# Patient Record
Sex: Female | Born: 1995 | Race: Black or African American | Hispanic: No | Marital: Single | Smoking: Former smoker
Health system: Southern US, Community
[De-identification: ages and names within clinical notes are randomized; demographics above are authoritative.]

## PROBLEM LIST (undated history)

## (undated) DIAGNOSIS — J45909 Unspecified asthma, uncomplicated: Secondary | ICD-10-CM

## (undated) DIAGNOSIS — N390 Urinary tract infection, site not specified: Secondary | ICD-10-CM

## (undated) DIAGNOSIS — A499 Bacterial infection, unspecified: Secondary | ICD-10-CM

## (undated) HISTORY — PX: NO PAST SURGERIES: SHX2092

## (undated) HISTORY — DX: Unspecified asthma, uncomplicated: J45.909

---
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
## 2015-01-06 ENCOUNTER — Ambulatory Visit: Payer: Medicaid Other | Admitting: Certified Nurse Midwife

## 2015-01-11 ENCOUNTER — Telehealth: Payer: Self-pay | Admitting: *Deleted

## 2015-01-11 NOTE — Telephone Encounter (Signed)
Pt called to office requesting appt for IUD removal. In review with Rachelle, pt is advised to keep IUD in for 4 months to allow her body to adjust to IUD.  Pt was last seen in office on 12/21/14. Per [place], IUD to not be removed.  Attempt to contact pt, LM on VM to call office.

## 2015-01-13 ENCOUNTER — Encounter: Payer: Self-pay | Admitting: Certified Nurse Midwife

## 2015-01-13 ENCOUNTER — Ambulatory Visit (INDEPENDENT_AMBULATORY_CARE_PROVIDER_SITE_OTHER): Payer: Medicaid Other | Admitting: Certified Nurse Midwife

## 2015-01-13 VITALS — BP 119/69 | HR 76 | Temp 98.3°F | Wt 123.0 lb

## 2015-01-13 DIAGNOSIS — Z30431 Encounter for routine checking of intrauterine contraceptive device: Secondary | ICD-10-CM | POA: Diagnosis not present

## 2015-01-13 DIAGNOSIS — Z30013 Encounter for initial prescription of injectable contraceptive: Secondary | ICD-10-CM | POA: Diagnosis not present

## 2015-01-13 MED ORDER — MEDROXYPROGESTERONE ACETATE 150 MG/ML IM SUSP: 150.0000 mg | INTRAMUSCULAR | Status: DC

## 2015-01-14 NOTE — Progress Notes (Signed)
Patient ID: Karen Rojas, female   DOB: 1995/07/15, 19 y.o.   MRN: [id number]  Chief Complaint  Patient presents with  . Follow-up    HPI Karen Rojas is a 19 y.o. female.  Here for spotting/period like bleeding with Skyla and desires for it to be removed.  Reinforcement given about the body's need for time to adjust to the IUD.  Patient verabalized understanding.  Is not soaking pads with bleeding or having heavy cramping.  States that she is tired of the bleeding.  Education given about the normalcy of the bleeding and break in period of time.  Educated patient on POC:  Keep IUD, give one dose of Depo IM to stop current spotting.  Patient agreed to the plan.  Rx sent for Depo injection to the pharmacy, patient told to come back for the injection.  If still bleeding in 3 months, and not pleased with the IUD, then I would consider removal.  Is still sexually active.     HPI  Past Medical History  Diagnosis Date  . Asthma     History reviewed. No pertinent past surgical history.  Family History  Problem Relation Age of Onset  . Kidney disease Mother     Social History Social History  Substance Use Topics  . Smoking status: Former Games developermoker  . Smokeless tobacco: Never Used  . Alcohol Use: No    No Known Allergies  Current Outpatient Prescriptions  Medication Sig Dispense Refill  . cyclobenzaprine (FLEXERIL) 10 MG tablet Take 1 tablet (10 mg total) by mouth 3 (three) times daily as needed for muscle spasms. (Patient not taking: Reported on 01/13/2015) 30 tablet 0  . fluconazole (DIFLUCAN) 100 MG tablet Take 1 tablet (100 mg total) by mouth once. Repeat dose in 48-72 hour. (Patient not taking: Reported on 01/13/2015) 3 tablet 0  . ibuprofen (ADVIL,MOTRIN) 800 MG tablet Take 1 tablet (800 mg total) by mouth every 8 (eight) hours as needed. (Patient not taking: Reported on 01/13/2015) 60 tablet 1  . medroxyPROGESTERone (DEPO-PROVERA) 150 MG/ML injection Inject 1 mL (150 mg total) into  the muscle every 3 (three) months. 1 mL 0  . meloxicam (MOBIC) 15 MG tablet Take 1 tablet (15 mg total) by mouth daily. (Patient not taking: Reported on 01/13/2015) 30 tablet 2  . metroNIDAZOLE (FLAGYL) 500 MG tablet Take 1 tablet (500 mg total) by mouth 2 (two) times daily. (Patient not taking: Reported on 01/13/2015) 14 tablet 0  . misoprostol (CYTOTEC) 200 MCG tablet Take 1 tablet (200 mcg total) by mouth 4 (four) times daily. (Patient not taking: Reported on 01/13/2015) 3 tablet 0  . terconazole (TERAZOL 7) 0.4 % vaginal cream Place 1 applicator vaginally at bedtime. (Patient not taking: Reported on 01/13/2015) 45 g 0   No current facility-administered medications for this visit.    Review of Systems Review of Systems Constitutional: negative for fatigue and weight loss Respiratory: negative for cough and wheezing Cardiovascular: negative for chest pain, fatigue and palpitations Gastrointestinal: negative for abdominal pain and change in bowel habits Genitourinary:negative Integument/breast: negative for nipple discharge Musculoskeletal:negative for myalgias Neurological: negative for gait problems and tremors Behavioral/Psych: negative for abusive relationship, depression Endocrine: negative for temperature intolerance     Blood pressure 119/69, pulse 76, temperature 98.3 F (36.8 C), weight 123 lb (55.792 kg).  Physical Exam Physical Exam General:   alert  Skin:   no rash or abnormalities  Lungs:   clear to auscultation bilaterally  Heart:   regular rate  and rhythm, S1, S2 normal, no murmur, click, rub or gallop  Breasts:   normal without suspicious masses, skin or nipple changes or axillary nodes  Abdomen:  normal findings: no organomegaly, soft, non-tender and no hernia  Pelvis:  External genitalia: normal general appearance Urinary system: urethral meatus normal and bladder without fullness, nontender Vaginal: normal without tenderness, induration or masses Cervix: normal  appearance, strings present on exam.  + very small amount of brownish red menses in vault.   Adnexa: normal bimanual exam Uterus: anteverted and non-tender, normal size    50% of 15 min visit spent on counseling and coordination of care.   Data Reviewed Previous medical hx, meds  Assessment     IUD strings present, IUD positioned correctly Spotting with IUD Contraception management     Plan    No orders of the defined types were placed in this encounter.   Meds ordered this encounter  Medications  . medroxyPROGESTERone (DEPO-PROVERA) 150 MG/ML injection    Sig: Inject 1 mL (150 mg total) into the muscle every 3 (three) months.    Dispense:  1 mL    Refill:  0     Possible manan previous recordsgement options include: IUD removal after significant amount of break in time for body's adjustment.   Follow up in 3 months.

## 2015-01-18 NOTE — Telephone Encounter (Signed)
Pt has been seen in office since call. This call closed.

## 2015-02-21 ENCOUNTER — Ambulatory Visit: Payer: Medicaid Other | Admitting: Certified Nurse Midwife

## 2015-02-22 ENCOUNTER — Encounter: Payer: Self-pay | Admitting: Certified Nurse Midwife

## 2015-02-22 ENCOUNTER — Ambulatory Visit (INDEPENDENT_AMBULATORY_CARE_PROVIDER_SITE_OTHER): Payer: Medicaid Other | Admitting: Certified Nurse Midwife

## 2015-02-22 VITALS — BP 117/73 | HR 69 | Wt 119.0 lb

## 2015-02-22 DIAGNOSIS — R102 Pelvic and perineal pain: Secondary | ICD-10-CM

## 2015-02-22 DIAGNOSIS — N760 Acute vaginitis: Secondary | ICD-10-CM | POA: Diagnosis not present

## 2015-02-22 DIAGNOSIS — Z8742 Personal history of other diseases of the female genital tract: Secondary | ICD-10-CM

## 2015-02-22 DIAGNOSIS — B9689 Other specified bacterial agents as the cause of diseases classified elsewhere: Secondary | ICD-10-CM

## 2015-02-22 DIAGNOSIS — A499 Bacterial infection, unspecified: Secondary | ICD-10-CM

## 2015-02-22 MED ORDER — METRONIDAZOLE 0.75 % VA GEL: 1.0000 | VAGINAL | Status: DC

## 2015-02-22 NOTE — Addendum Note (Signed)
Addended by: Marya Landry D on: 02/22/2015 04:39 PM   Modules accepted: Orders

## 2015-02-22 NOTE — Progress Notes (Signed)
Patient ID: Karen Rojas, female   DOB: 07-10-1995, 20 y.o.   MRN: [id number]  Chief Complaint  Patient presents with  . Advice Only    wants skyla removed    HPI Karen Rojas is a 20 y.o. female.  Here for f/u on Skyla IUD.  Patient desires removal.  Patient states that she is not having any bleeding.  She was having long, painful periods with clots along with dysmenorrhea.  States that is all improved with IUD.  Still desires removal.  Encouraged to state why.  When stating why she describes cramping after sexual intercourse, no pain during sexual intercourse.  Denies any pain after a few hours post sexual intercourse, and cannot feel the IUD.  Has not felt for the strings.  Encouraged patient to wait it has only been about two months since the insertion.  Patient also states that she had cramping before and after sexual intercourse before the IUD.  I encouraged the patient that removing the IUD is not going to solve the problem and may make it worse.   I also educated the patient that the side effects from Depo injections are worse than the IUD.      HPI  Past Medical History  Diagnosis Date  . Asthma     No past surgical history on file.  Family History  Problem Relation Age of Onset  . Kidney disease Mother     Social History Social History  Substance Use Topics  . Smoking status: Former Games developer  . Smokeless tobacco: Never Used  . Alcohol Use: No    No Known Allergies  Current Outpatient Prescriptions  Medication Sig Dispense Refill  . cyclobenzaprine (FLEXERIL) 10 MG tablet Take 1 tablet (10 mg total) by mouth 3 (three) times daily as needed for muscle spasms. (Patient not taking: Reported on 01/13/2015) 30 tablet 0  . ibuprofen (ADVIL,MOTRIN) 800 MG tablet Take 1 tablet (800 mg total) by mouth every 8 (eight) hours as needed. (Patient not taking: Reported on 01/13/2015) 60 tablet 1  . medroxyPROGESTERone (DEPO-PROVERA) 150 MG/ML injection Inject 1 mL (150 mg total) into  the muscle every 3 (three) months. (Patient not taking: Reported on 02/22/2015) 1 mL 0  . meloxicam (MOBIC) 15 MG tablet Take 1 tablet (15 mg total) by mouth daily. (Patient not taking: Reported on 01/13/2015) 30 tablet 2  . metroNIDAZOLE (METROGEL VAGINAL) 0.75 % vaginal gel Place 1 Applicatorful vaginally 2 (two) times a week. For 4-6 months. 70 g 4   No current facility-administered medications for this visit.    Review of Systems Review of Systems Constitutional: negative for fatigue and weight loss Respiratory: negative for cough and wheezing Cardiovascular: negative for chest pain, fatigue and palpitations Gastrointestinal: negative for abdominal pain and change in bowel habits Genitourinary: +cramping with sexual intercourse, change in vaginal discharge Integument/breast: negative for nipple discharge Musculoskeletal:negative for myalgias Neurological: negative for gait problems and tremors Behavioral/Psych: negative for abusive relationship, depression Endocrine: negative for temperature intolerance     Blood pressure 117/73, pulse 69, weight 119 lb (53.978 kg).  Physical Exam Physical Exam General:   alert  Skin:   no rash or abnormalities  Lungs:   clear to auscultation bilaterally  Heart:   regular rate and rhythm, S1, S2 normal, no murmur, click, rub or gallop  Breasts:   normal without suspicious masses, skin or nipple changes or axillary nodes  Abdomen:  normal findings: no organomegaly, soft, non-tender and no hernia  Pelvis:  External genitalia:  normal general appearance Urinary system: urethral meatus normal and bladder without fullness, nontender Vaginal: normal without tenderness, induration or masses Cervix: no CMT Adnexa: normal bimanual exam, slightly enlarged on right side with slight tenderness Uterus: anteverted and non-tender, normal size    50% of 15 min visit spent on counseling and coordination of care.   Data Reviewed Previous medical hx,  meds  Assessment     IUD in place ? Ovarian cyst Chronic BV     Plan    Orders Placed This Encounter  Procedures  . US Transvaginal Non-OB    Standing Status: Future     Number of Occurrences:      Standing Expiration Date: 04/21/2016    Order Specific Question:  Reason for Exam (SYMPTOM  OR DIAGNOSIS REQUIRED)    Answer:  lower abdominal cramping left side, hx of ovarian cyst    Order Specific Question:  Preferred imaging location?    Answer:  Internal  . US Pelvis Complete    Standing Status: Future     Number of Occurrences:      Standing Expiration Date: 04/21/2016    Order Specific Question:  Reason for Exam (SYMPTOM  OR DIAGNOSIS REQUIRED)    Answer:  lower abdominal cramping left side, hx of ovarian cyst    Order Specific Question:  Preferred imaging location?    Answer:  Internal   Meds ordered this encounter  Medications  . metroNIDAZOLE (METROGEL VAGINAL) 0.75 % vaginal gel    Sig: Place 1 Applicatorful vaginally 2 (two) times a week. For 4-6 months.    Dispense:  70 g    Refill:  4     Follow up after [place].

## 2015-02-25 ENCOUNTER — Other Ambulatory Visit: Payer: Self-pay | Admitting: Certified Nurse Midwife

## 2015-02-25 LAB — SURESWAB, VAGINOSIS/VAGINITIS PLUS
Atopobium vaginae: 7.1 Log (cells/mL)
C. PARAPSILOSIS, DNA: NOT DETECTED
C. TROPICALIS, DNA: NOT DETECTED
C. albicans, DNA: NOT DETECTED
C. glabrata, DNA: NOT DETECTED
C. trachomatis RNA, TMA: NOT DETECTED
Gardnerella vaginalis: >8 Log (cells/mL)
LACTOBACILLUS SPECIES: NOT DETECTED Log (cells/mL)
MEGASPHAERA SPECIES: 7.9 Log (cells/mL)
N. GONORRHOEAE RNA, TMA: NOT DETECTED
T. vaginalis RNA, QL TMA: NOT DETECTED

## 2015-02-28 ENCOUNTER — Telehealth: Payer: Self-pay

## 2015-02-28 NOTE — Telephone Encounter (Signed)
Patient made new gyn appt at [facility] on 03/23/15 - she had ultrasound and f/u appt with Rachelle on 2/2 - patient never returned messages regarding those appts,  took off our sch, ok per [employer].

## 2015-03-02 ENCOUNTER — Ambulatory Visit: Payer: Medicaid Other | Admitting: Certified Nurse Midwife

## 2015-03-02 ENCOUNTER — Other Ambulatory Visit: Payer: Medicaid Other

## 2015-03-06 ENCOUNTER — Encounter (HOSPITAL_COMMUNITY): Payer: Self-pay | Admitting: *Deleted

## 2015-03-06 ENCOUNTER — Emergency Department (HOSPITAL_COMMUNITY)
Admission: EM | Admit: 2015-03-06 | Discharge: 2015-03-06 | Discharge status: Against Medical Advice | Payer: Medicaid Other | Attending: Emergency Medicine | Admitting: Emergency Medicine

## 2015-03-06 DIAGNOSIS — Z79899 Other long term (current) drug therapy: Secondary | ICD-10-CM | POA: Diagnosis not present

## 2015-03-06 DIAGNOSIS — J45909 Unspecified asthma, uncomplicated: Secondary | ICD-10-CM | POA: Insufficient documentation

## 2015-03-06 DIAGNOSIS — Z87891 Personal history of nicotine dependence: Secondary | ICD-10-CM | POA: Diagnosis not present

## 2015-03-06 DIAGNOSIS — N898 Other specified noninflammatory disorders of vagina: Secondary | ICD-10-CM | POA: Diagnosis not present

## 2015-03-06 DIAGNOSIS — Z3202 Encounter for pregnancy test, result negative: Secondary | ICD-10-CM | POA: Insufficient documentation

## 2015-03-06 DIAGNOSIS — Z30431 Encounter for routine checking of intrauterine contraceptive device: Secondary | ICD-10-CM | POA: Insufficient documentation

## 2015-03-06 DIAGNOSIS — N949 Unspecified condition associated with female genital organs and menstrual cycle: Secondary | ICD-10-CM

## 2015-03-06 LAB — WET PREP, GENITAL
SPERM: NONE SEEN
TRICH WET PREP: NONE SEEN
YEAST WET PREP: NONE SEEN

## 2015-03-06 LAB — URINALYSIS, ROUTINE W REFLEX MICROSCOPIC
Bilirubin Urine: NEGATIVE
GLUCOSE, UA: NEGATIVE mg/dL
HGB URINE DIPSTICK: NEGATIVE
KETONES UR: 15 mg/dL — AB
LEUKOCYTES UA: NEGATIVE
Nitrite: NEGATIVE
PROTEIN: NEGATIVE mg/dL
Specific Gravity, Urine: 1.024 (ref 1.005–1.030)
pH: 6.5 (ref 5.0–8.0)

## 2015-03-06 LAB — POC URINE PREG, ED: PREG TEST UR: NEGATIVE

## 2015-03-06 NOTE — ED Provider Notes (Signed)
CSN: [id number]     Arrival date & time 03/06/15  1805 History  By signing my name below, I, Linus Galas, attest that this documentation has been prepared under the direction and in the presence of Arthor Captain, PA-C. Electronically Signed: Linus Galas, ED Scribe. 03/06/2015. 9:20 PM.  Chief Complaint  Patient presents with  . Dysuria  . Vaginal Discharge   The history is provided by the patient. No language interpreter was used.   HPI Comments: Karen Rojas is a 20 y.o. female with no past pertinent history who presents to the Emergency Department complaining of throbbing lower abdominal pain for the past couple of days. Pt also reports difficulty urinating. Pt states she had a Skyla IUD placed a couple of months ago and has been having abdominal cramping since but began having these symptoms only for the past couple of days. Pts last sexual intercourse was 2 weeks ago. She is in a monogamous relationship. Pt denies any nausea, vomiting or any other sx at this time.   Past Medical History  Diagnosis Date  . Asthma    History reviewed. No pertinent past surgical history. Family History  Problem Relation Age of Onset  . Kidney disease Mother    Social History  Substance Use Topics  . Smoking status: Former Games developer  . Smokeless tobacco: Never Used  . Alcohol Use: No   OB History    Gravida Para Term Preterm AB TAB SAB Ectopic Multiple Living       Review of Systems  Constitutional: Negative for fever and chills.  Gastrointestinal: Positive for abdominal pain. Negative for nausea and vomiting.  Genitourinary: Positive for difficulty urinating.   Allergies  Review of patient's allergies indicates no known allergies.  Home Medications   Prior to Admission medications   Medication Sig Start Date End Date Taking? Authorizing Provider  cyclobenzaprine (FLEXERIL) 10 MG tablet Take 1 tablet (10 mg total) by mouth 3 (three) times daily as needed for muscle  spasms. Patient not taking: Reported on 01/13/2015 12/21/14   Rachelle A Denney, CNM  ibuprofen (ADVIL,MOTRIN) 800 MG tablet Take 1 tablet (800 mg total) by mouth every 8 (eight) hours as needed. Patient not taking: Reported on 01/13/2015 12/01/14   Roe Coombs, CNM  medroxyPROGESTERone (DEPO-PROVERA) 150 MG/ML injection Inject 1 mL (150 mg total) into the muscle every 3 (three) months. Patient not taking: Reported on 02/22/2015 01/13/15   Roe Coombs, CNM  meloxicam (MOBIC) 15 MG tablet Take 1 tablet (15 mg total) by mouth daily. Patient not taking: Reported on 01/13/2015 12/21/14   Rachelle A Denney, CNM  metroNIDAZOLE (METROGEL VAGINAL) 0.75 % vaginal gel Place 1 Applicatorful vaginally 2 (two) times a week. For 4-6 months. 02/22/15   Rachelle A Denney, CNM   BP 141/64 mmHg  Pulse 85  Temp(Src) 98.7 F (37.1 C) (Oral)  Resp 16  SpO2 100%   Physical Exam  Constitutional: She is oriented to person, place, and time. She appears well-developed and well-nourished.  HENT:  Head: Normocephalic and atraumatic.  Cardiovascular: Normal rate.   Pulmonary/Chest: Effort normal.  Abdominal: She exhibits no distension.  Genitourinary:  Pelvic exam: normal external genitalia, vulva, vagina, cervix, uterus and adnexa. IUD strings are visible from cervical os. She has no CMT, no adnexal pain or masses.   Neurological: She is alert and oriented to person, place, and time.  Skin: Skin is warm and dry.  Psychiatric: She has  a normal mood and affect.  Nursing note and vitals reviewed.   ED Course  Procedures  DIAGNOSTIC STUDIES: Oxygen Saturation is 100% on room air, normal by my interpretation.    COORDINATION OF CARE: 8:54 PM Will order pregnancy screening and urinalysis. Discussed treatment plan with pt at bedside and pt agreed to plan.  Labs Review Labs Reviewed  URINALYSIS, ROUTINE W REFLEX MICROSCOPIC (NOT AT [facility]) - Abnormal; Notable for the following:    Ketones, ur 15 (*)     All other components within normal limits  WET PREP, GENITAL  POC URINE PREG, ED  GC/CHLAMYDIA PROBE AMP ([place]) NOT AT [facility]    Imaging Review No results found.    EKG Interpretation None      MDM   Final diagnoses:  None    Patient asked for me to remove her IUD. I told her that she would need to have this done at her ob gyn. Patient became upset and refused to wait for wet prep. UA and preg test negative. Patient eloped from ED. I personally performed the services described in this documentation, which was scribed in my presence. The recorded information has been reviewed and is accurate.    Arthor Captain, PA-C 03/08/15 1918  Loren Racer, MD 03/11/15 [phone]

## 2015-03-06 NOTE — ED Notes (Signed)
Pt walked out of the ER in NAD

## 2015-03-06 NOTE — ED Notes (Signed)
Pt reports lower abd pain and cramping. Has been seen at [facility] clinic for same. Having vaginal discharge and difficulty urinating.

## 2015-03-07 LAB — GC/CHLAMYDIA PROBE AMP (~~LOC~~) NOT AT ARMC
Chlamydia: NEGATIVE
Neisseria Gonorrhea: NEGATIVE

## 2015-03-23 ENCOUNTER — Encounter: Payer: Self-pay | Admitting: Obstetrics & Gynecology

## 2015-04-28 ENCOUNTER — Encounter (HOSPITAL_COMMUNITY): Payer: Self-pay

## 2015-04-28 ENCOUNTER — Emergency Department (HOSPITAL_COMMUNITY)
Admission: EM | Admit: 2015-04-28 | Discharge: 2015-04-28 | Disposition: A | Discharge status: Home / Self Care | Payer: Medicaid Other | Attending: Emergency Medicine | Admitting: Emergency Medicine

## 2015-04-28 DIAGNOSIS — J45909 Unspecified asthma, uncomplicated: Secondary | ICD-10-CM | POA: Diagnosis not present

## 2015-04-28 DIAGNOSIS — R8290 Unspecified abnormal findings in urine: Secondary | ICD-10-CM | POA: Diagnosis not present

## 2015-04-28 DIAGNOSIS — R300 Dysuria: Secondary | ICD-10-CM | POA: Diagnosis present

## 2015-04-28 DIAGNOSIS — Z8619 Personal history of other infectious and parasitic diseases: Secondary | ICD-10-CM | POA: Diagnosis not present

## 2015-04-28 DIAGNOSIS — R350 Frequency of micturition: Secondary | ICD-10-CM | POA: Diagnosis not present

## 2015-04-28 DIAGNOSIS — Z87891 Personal history of nicotine dependence: Secondary | ICD-10-CM | POA: Diagnosis not present

## 2015-04-28 HISTORY — DX: Bacterial infection, unspecified: A49.9

## 2015-04-28 HISTORY — DX: Urinary tract infection, site not specified: N39.0

## 2015-04-28 LAB — URINALYSIS, ROUTINE W REFLEX MICROSCOPIC
BILIRUBIN URINE: NEGATIVE
Glucose, UA: NEGATIVE mg/dL
HGB URINE DIPSTICK: NEGATIVE
KETONES UR: NEGATIVE mg/dL
NITRITE: NEGATIVE
PH: 7.5 (ref 5.0–8.0)
Protein, ur: NEGATIVE mg/dL
Specific Gravity, Urine: 1.017 (ref 1.005–1.030)

## 2015-04-28 LAB — URINE MICROSCOPIC-ADD ON

## 2015-04-28 MED ORDER — CIPROFLOXACIN HCL 500 MG PO TABS: 500.0000 mg | ORAL_TABLET | Freq: Two times a day (BID) | ORAL | Status: DC

## 2015-04-28 MED ORDER — PHENAZOPYRIDINE HCL 200 MG PO TABS: 200.0000 mg | ORAL_TABLET | Freq: Three times a day (TID) | ORAL | Status: DC | PRN

## 2015-04-28 NOTE — ED Notes (Signed)
Pt with burning and irritation urinary x 3 days.  Pt able to empty bladder.  No flank pain.  No fever. Some discharge color

## 2015-04-28 NOTE — ED Provider Notes (Signed)
CSN: [id number]     Arrival date & time 04/28/15  1544 History   First MD Initiated Contact with Patient 04/28/15 1856     Chief Complaint  Patient presents with  . Dysuria     (Consider location/radiation/quality/duration/timing/severity/associated sxs/prior Treatment) HPI This is a 20 year old female who presents emergency Department with chief complaint of dysuria and urinary frequency. Patient states she's had multiple urinary tract infections before and this feels similar. She denies suprapubic pain, vaginal symptoms, chills, fever, back pain.   Past Medical History  Diagnosis Date  . Asthma   . Urinary tract bacterial infections    No past surgical history on file. Family History  Problem Relation Age of Onset  . Kidney disease Mother    Social History  Substance Use Topics  . Smoking status: Former Games developermoker  . Smokeless tobacco: Never Used  . Alcohol Use: No   OB History    Gravida Para Term Preterm AB TAB SAB Ectopic Multiple Living   0 0 0 0 0 0 0 0 0 0      Review of Systems  Ten systems reviewed and are negative for acute change, except as noted in the HPI.    Allergies  Review of patient's allergies indicates no known allergies.  Home Medications   Prior to Admission medications   Medication Sig Start Date End Date Taking? Authorizing Provider  ciprofloxacin (CIPRO) 500 MG tablet Take 1 tablet (500 mg total) by mouth 2 (two) times daily. 04/28/15   Arthor CaptainAbigail Tenzin Pavon, PA-C  cyclobenzaprine (FLEXERIL) 10 MG tablet Take 1 tablet (10 mg total) by mouth 3 (three) times daily as needed for muscle spasms. Patient not taking: Reported on 01/13/2015 12/21/14   Rachelle A Denney, CNM  ibuprofen (ADVIL,MOTRIN) 800 MG tablet Take 1 tablet (800 mg total) by mouth every 8 (eight) hours as needed. Patient not taking: Reported on 01/13/2015 12/01/14   Roe Coombsachelle A Denney, CNM  medroxyPROGESTERone (DEPO-PROVERA) 150 MG/ML injection Inject 1 mL (150 mg total) into the muscle every  3 (three) months. Patient not taking: Reported on 02/22/2015 01/13/15   Roe Coombsachelle A Denney, CNM  meloxicam (MOBIC) 15 MG tablet Take 1 tablet (15 mg total) by mouth daily. Patient not taking: Reported on 01/13/2015 12/21/14   Rachelle A Denney, CNM  metroNIDAZOLE (METROGEL VAGINAL) 0.75 % vaginal gel Place 1 Applicatorful vaginally 2 (two) times a week. For 4-6 months. 02/22/15   Roe Coombsachelle A Denney, CNM  phenazopyridine (PYRIDIUM) 200 MG tablet Take 1 tablet (200 mg total) by mouth 3 (three) times daily as needed (pain with urination). 04/28/15   Revonda Menter, PA-C   BP 125/79 mmHg  Pulse 95  Temp(Src) 98.1 F (36.7 C) (Oral)  Resp 16  SpO2 95% Physical Exam  Constitutional: She is oriented to person, place, and time. She appears well-developed and well-nourished. No distress.  HENT:  Head: Normocephalic and atraumatic.  Eyes: Conjunctivae are normal. No scleral icterus.  Neck: Normal range of motion.  Cardiovascular: Normal rate, regular rhythm and normal heart sounds.  Exam reveals no gallop and no friction rub.   No murmur heard. Pulmonary/Chest: Effort normal and breath sounds normal. No respiratory distress.  Abdominal: Soft. Bowel sounds are normal. She exhibits no distension and no mass. There is no tenderness. There is no guarding.   No cva tenderness   Neurological: She is alert and oriented to person, place, and time.  Skin: Skin is warm and dry. She is not diaphoretic.  Nursing note and vitals reviewed.  ED Course  Procedures (including critical care time) Labs Review Labs Reviewed  URINALYSIS, ROUTINE W REFLEX MICROSCOPIC (NOT AT [facility]) - Abnormal; Notable for the following:    APPearance HAZY (*)    Leukocytes, UA SMALL (*)    All other components within normal limits  URINE MICROSCOPIC-ADD ON - Abnormal; Notable for the following:    Squamous Epithelial / LPF 0-5 (*)    Bacteria, UA RARE (*)    All other components within normal limits    Imaging Review No  results found. I have personally reviewed and evaluated these images and lab results as part of my medical decision-making.   EKG Interpretation None      MDM   Final diagnoses:  Abnormal urinalysis    7:30 PM The UA is equivocal but patient states that she has had multiple UTIs. Review of the EMR urine culture shows no resistance to cipro. Will DC with CIPRO and pyridium.  Pt has been diagnosed with a UTI. Pt is afebrile, no CVA tenderness, normotensive, and denies N/V. Pt to be dc home with antibiotics and instructions to follow up with PCP if symptoms persist.    Arthor Captain, PA-C 04/28/15 1934  Alvira Monday, MD 05/01/15 1429

## 2015-04-28 NOTE — Discharge Instructions (Signed)

## 2015-05-29 ENCOUNTER — Encounter (HOSPITAL_COMMUNITY): Payer: Self-pay | Admitting: Emergency Medicine

## 2015-05-29 ENCOUNTER — Ambulatory Visit (HOSPITAL_COMMUNITY)
Admission: EM | Admit: 2015-05-29 | Discharge: 2015-05-29 | Disposition: A | Discharge status: Home / Self Care | Payer: Medicaid Other | Attending: Family Medicine | Admitting: Family Medicine

## 2015-05-29 DIAGNOSIS — N898 Other specified noninflammatory disorders of vagina: Secondary | ICD-10-CM

## 2015-05-29 DIAGNOSIS — N760 Acute vaginitis: Secondary | ICD-10-CM | POA: Diagnosis not present

## 2015-05-29 LAB — POCT URINALYSIS DIP (DEVICE)
Bilirubin Urine: NEGATIVE
Glucose, UA: NEGATIVE mg/dL
HGB URINE DIPSTICK: NEGATIVE
KETONES UR: NEGATIVE mg/dL
Leukocytes, UA: NEGATIVE
Nitrite: NEGATIVE
PH: 6.5 (ref 5.0–8.0)
PROTEIN: NEGATIVE mg/dL
SPECIFIC GRAVITY, URINE: 1.02 (ref 1.005–1.030)
Urobilinogen, UA: 1 mg/dL (ref 0.0–1.0)

## 2015-05-29 LAB — POCT PREGNANCY, URINE: PREG TEST UR: NEGATIVE

## 2015-05-29 MED ORDER — METRONIDAZOLE 500 MG PO TABS: 500.0000 mg | ORAL_TABLET | Freq: Two times a day (BID) | ORAL | Status: DC

## 2015-05-29 MED ORDER — FLUCONAZOLE 150 MG PO TABS: ORAL_TABLET | ORAL | Status: DC

## 2015-05-29 NOTE — ED Provider Notes (Signed)
CSN: [id number]     Arrival date & time 05/29/15  1603 History   First MD Initiated Contact with Patient 05/29/15 1721     Chief Complaint  Patient presents with  . Vaginitis   (Consider location/radiation/quality/duration/timing/severity/associated sxs/prior Treatment) HPI Comments: 20 year old female complaining of a vaginal discharge for 2 weeks. She is also complaining of vulvar itching and irritation. Denies pelvic pain.  LMP 05/18/2015.   Past Medical History  Diagnosis Date  . Asthma   . Urinary tract bacterial infections    History reviewed. No pertinent past surgical history. Family History  Problem Relation Age of Onset  . Kidney disease Mother    Social History  Substance Use Topics  . Smoking status: Former Games developer  . Smokeless tobacco: Never Used  . Alcohol Use: No   OB History    Gravida Para Term Preterm AB TAB SAB Ectopic Multiple Living       Review of Systems  Constitutional: Negative.   Respiratory: Negative.   Genitourinary: Positive for vaginal discharge and vaginal pain. Negative for dysuria, urgency, frequency, hematuria, flank pain, vaginal bleeding, menstrual problem and pelvic pain.  Musculoskeletal: Negative.   Neurological: Negative.   Psychiatric/Behavioral: Negative.   All other systems reviewed and are negative.   Allergies  Review of patient's allergies indicates no known allergies.  Home Medications   Prior to Admission medications   Medication Sig Start Date End Date Taking? Authorizing Provider  fluconazole (DIFLUCAN) 150 MG tablet 1 tab po x 1. May repeat in 72 hours if no improvement 05/29/15   Hayden Rasmussen, NP  metroNIDAZOLE (FLAGYL) 500 MG tablet Take 1 tablet (500 mg total) by mouth 2 (two) times daily. X 7 days 05/29/15   Hayden Rasmussen, NP   Meds Ordered and Administered this Visit  Medications - No data to display  BP 106/72 mmHg  Pulse 72  Temp(Src) 98.1 F (36.7 C) (Oral)  Resp 12  SpO2 100% No data  found.   Physical Exam  Constitutional: She is oriented to person, place, and time. She appears well-developed and well-nourished. No distress.  Eyes: EOM are normal.  Neck: Normal range of motion. Neck supple.  Cardiovascular: Normal rate.   Pulmonary/Chest: Effort normal. No respiratory distress.  Genitourinary:  Normal external female genitalia. Urethral meatus intact. No swelling or erythema. Vaginal walls and cervix coated with thick white discharge. No cottage cheese discharge. No erythema. Ectocervix is pink, smooth no lesions observed. No CMT or adnexal tenderness.  Musculoskeletal: She exhibits no edema.  Neurological: She is alert and oriented to person, place, and time. She exhibits normal muscle tone.  Skin: Skin is warm and dry.  Psychiatric: She has a normal mood and affect.  Nursing note and vitals reviewed.   ED Course  Procedures (including critical care time)  Labs Review Labs Reviewed  POCT URINALYSIS DIP (DEVICE)  POCT PREGNANCY, URINE  CERVICOVAGINAL ANCILLARY ONLY   Results for orders placed or performed during the hospital encounter of 05/29/15  POCT urinalysis dip (device)  Result Value Ref Range   Glucose, UA NEGATIVE NEGATIVE mg/dL   Bilirubin Urine NEGATIVE NEGATIVE   Ketones, ur NEGATIVE NEGATIVE mg/dL   Specific Gravity, Urine 1.020 1.005 - 1.030   Hgb urine dipstick NEGATIVE NEGATIVE   pH 6.5 5.0 - 8.0   Protein, ur NEGATIVE NEGATIVE mg/dL   Urobilinogen, UA 1.0 0.0 - 1.0 mg/dL   Nitrite NEGATIVE NEGATIVE   Leukocytes, UA  NEGATIVE NEGATIVE  Pregnancy, urine POC  Result Value Ref Range   Preg Test, Ur NEGATIVE NEGATIVE     Imaging Review No results found.   Visual Acuity Review  Right Eye Distance:   Left Eye Distance:   Bilateral Distance:    Right Eye Near:   Left Eye Near:    Bilateral Near:         MDM   1. Vaginal discharge   2. Vaginitis    Meds ordered this encounter  Medications  . metroNIDAZOLE (FLAGYL)  500 MG tablet    Sig: Take 1 tablet (500 mg total) by mouth 2 (two) times daily. X 7 days    Dispense:  14 tablet    Refill:  0    Order Specific Question:  Supervising Provider    Answer:  Linna HoffKINDL, JAMES D [phone]]  . fluconazole (DIFLUCAN) 150 MG tablet    Sig: 1 tab po x 1. May repeat in 72 hours if no improvement    Dispense:  2 tablet    Refill:  0    Order Specific Question:  Supervising Provider    Answer:  Bradd CanaryKINDL, JAMES D [phone][[id number]]   Cervical cytolgy pending.    Hayden Rasmussenavid Glennice Marcos, NP 05/29/15 [phone]

## 2015-05-29 NOTE — ED Notes (Signed)
Patient reports having vaginal discharge and irritation for approx 3 weeks.  Patient has burning with urination, burning involves the outside of body.

## 2015-05-29 NOTE — Discharge Instructions (Signed)
Vaginitis Vaginitis is an inflammation of the vagina. It can happen when the normal bacteria and yeast in the vagina grow too much. There are different types. Treatment will depend on the type you have. HOME CARE  Take all medicines as told by your doctor.  Keep your vagina area clean and dry. Avoid soap. Rinse the area with water.  Avoid washing and cleaning out the vagina (douching).  Do not use tampons or have sex (intercourse) until your treatment is done.  Wipe from front to back after going to the restroom.  Wear cotton underwear.  Avoid wearing underwear while you sleep until your vaginitis is gone.  Avoid tight pants. Avoid underwear or nylons without a cotton panel.  Take off wet clothing (such as a bathing suit) as soon as you can.  Use mild, unscented products. Avoid fabric softeners and scented:  Feminine sprays.  Laundry detergents.  Tampons.  Soaps or bubble baths.  Practice safe sex and use condoms. GET HELP RIGHT AWAY IF:   You have belly (abdominal) pain.  You have a fever or lasting symptoms for more than 2-3 days.  You have a fever and your symptoms suddenly get worse. MAKE SURE YOU:   Understand these instructions.  Will watch this condition.  Will get help right away if you are not doing well or get worse.   This information is not intended to replace advice given to you by your health care provider. Make sure you discuss any questions you have with your health care provider.   Document Released: 04/12/2008 Document Revised: 10/09/2011 Document Reviewed: 06/27/2011 Elsevier Interactive Patient Education 2016 Elsevier Inc.  Bacterial Vaginosis Bacterial vaginosis is an infection of the vagina. It happens when too many germs (bacteria) grow in the vagina. Having this infection puts you at risk for getting other infections from sex. Treating this infection can help lower your risk for other infections, such as:    Chlamydia.  Gonorrhea.  HIV.  Herpes. HOME CARE  Take your medicine as told by your doctor.  Finish your medicine even if you start to feel better.  Tell your sex partner that you have an infection. They should see their doctor for treatment.  During treatment:  Avoid sex or use condoms correctly.  Do not douche.  Do not drink alcohol unless your doctor tells you it is ok.  Do not breastfeed unless your doctor tells you it is ok. GET HELP IF:  You are not getting better after 3 days of treatment.  You have more grey fluid (discharge) coming from your vagina than before.  You have more pain than before.  You have a fever. MAKE SURE YOU:   Understand these instructions.  Will watch your condition.  Will get help right away if you are not doing well or get worse.   This information is not intended to replace advice given to you by your health care provider. Make sure you discuss any questions you have with your health care provider.   Document Released: 10/24/2007 Document Revised: 02/04/2014 Document Reviewed: 08/26/2012 Elsevier Interactive Patient Education 2016 [employer].  Sexually Transmitted Disease A sexually transmitted disease (STD) is a disease or infection often passed to another person during sex. However, STDs can be passed through nonsexual ways. An STD can be passed through:  Spit (saliva).  Semen.  Blood.  Mucus from the vagina.  Pee (urine). HOW CAN I LESSEN MY CHANCES OF GETTING AN STD?  Use:  Latex condoms.  Water-soluble  lubricants with condoms. Do not use petroleum jelly or oils.  Dental dams. These are small pieces of latex that are used as a barrier during oral sex.  Avoid having more than one sex partner.  Do not have sex with someone who has other sex partners.  Do not have sex with anyone you do not know or who is at high risk for an STD.  Avoid risky sex that can break your skin.  Do not have sex if you have  open sores on your mouth or skin.  Avoid drinking too much alcohol or taking illegal drugs. Alcohol and drugs can affect your good judgment.  Avoid oral and anal sex acts.  Get shots (vaccines) for HPV and hepatitis.  If you are at risk of being infected with HIV, it is advised that you take a certain medicine daily to prevent HIV infection. This is called pre-exposure prophylaxis (PrEP). You may be at risk if:  You are a man who has sex with other men (MSM).  You are attracted to the opposite sex (heterosexual) and are having sex with more than one partner.  You take drugs with a needle.  You have sex with someone who has HIV.  Talk with your doctor about if you are at high risk of being infected with HIV. If you begin to take PrEP, get tested for HIV first. Get tested every 3 months for as long as you are taking PrEP.  Get tested for STDs every year if you are sexually active. If you are treated for an STD, get tested again 3 months after you are treated. WHAT SHOULD I DO IF I THINK I HAVE AN STD?  See your doctor.  Tell your sex partner(s) that you have an STD. They should be tested and treated.  Do not have sex until your doctor says it is okay. WHEN SHOULD I GET HELP? Get help right away if:  You have bad belly (abdominal) pain.  You are a man and have puffiness (swelling) or pain in your testicles.  You are a woman and have puffiness in your vagina.   This information is not intended to replace advice given to you by your health care provider. Make sure you discuss any questions you have with your health care provider.   Document Released: 02/22/2004 Document Revised: 02/04/2014 Document Reviewed: 07/10/2012 Elsevier Interactive Patient Education [employer] Elsevier Inc.

## 2015-05-29 NOTE — ED Notes (Signed)
Verified phone number 

## 2015-05-30 LAB — CERVICOVAGINAL ANCILLARY ONLY
CHLAMYDIA, DNA PROBE: NEGATIVE
NEISSERIA GONORRHEA: NEGATIVE
Wet Prep (BD Affirm): NEGATIVE

## 2015-09-19 ENCOUNTER — Encounter (HOSPITAL_COMMUNITY): Payer: Self-pay | Admitting: Emergency Medicine

## 2015-09-19 ENCOUNTER — Ambulatory Visit (HOSPITAL_COMMUNITY)
Admission: EM | Admit: 2015-09-19 | Discharge: 2015-09-19 | Disposition: A | Discharge status: Home / Self Care | Payer: Medicaid Other | Attending: Emergency Medicine | Admitting: Emergency Medicine

## 2015-09-19 DIAGNOSIS — N390 Urinary tract infection, site not specified: Secondary | ICD-10-CM | POA: Diagnosis not present

## 2015-09-19 LAB — POCT URINALYSIS DIP (DEVICE)
Bilirubin Urine: NEGATIVE
GLUCOSE, UA: NEGATIVE mg/dL
KETONES UR: NEGATIVE mg/dL
Nitrite: NEGATIVE
Protein, ur: NEGATIVE mg/dL
Urobilinogen, UA: 0.2 mg/dL (ref 0.0–1.0)
pH: 5 (ref 5.0–8.0)

## 2015-09-19 MED ORDER — CEPHALEXIN 500 MG PO CAPS: 500.0000 mg | ORAL_CAPSULE | Freq: Four times a day (QID) | ORAL | 0 refills | Status: DC

## 2015-09-19 NOTE — Discharge Instructions (Signed)
Drink lots of water May take AZO Standard for urinary symptoms for 2 days as needed

## 2015-09-19 NOTE — ED Triage Notes (Signed)
Pt has been suffering from lower abdominal pain and urinary frequency for 3 days.  She reports recurrent UTI's and states these are the same symptoms.

## 2015-09-19 NOTE — ED Provider Notes (Signed)
CSN: [id number]     Arrival date & time 09/19/15  1317 History   First MD Initiated Contact with Patient 09/19/15 1441     Chief Complaint  Patient presents with  . Abdominal Pain  . Urinary Frequency   (Consider location/radiation/quality/duration/timing/severity/associated sxs/prior Treatment) 20 year old female complaining of urinary frequency and dysuria with suprapubic discomfort for about 3 days. Denies abnormal vaginal discharge. She occasionally has nausea but no vomiting. Last menstrual period was 12 the end of last month states it is approximately time for it began. She has not missed any periods.      Past Medical History:  Diagnosis Date  . Asthma   . Urinary tract bacterial infections    History reviewed. No pertinent surgical history. Family History  Problem Relation Age of Onset  . Kidney disease Mother    Social History  Substance Use Topics  . Smoking status: Former Games developermoker  . Smokeless tobacco: Never Used  . Alcohol use No   OB History    Gravida Para Term Preterm AB Living   0 0 0 0 0 0   SAB TAB Ectopic Multiple Live Births   0 0 0 0       Review of Systems  Constitutional: Negative.   HENT: Negative.   Respiratory: Negative.   Gastrointestinal: Positive for nausea. Negative for abdominal pain and vomiting.  Genitourinary: Positive for dysuria, frequency and urgency. Negative for menstrual problem, vaginal bleeding and vaginal discharge.  Skin: Negative.   All other systems reviewed and are negative.   Allergies  Review of patient's allergies indicates no known allergies.  Home Medications   Prior to Admission medications   Medication Sig Start Date End Date Taking? Authorizing Provider  cephALEXin (KEFLEX) 500 MG capsule Take 1 capsule (500 mg total) by mouth 4 (four) times daily. 09/19/15   Hayden Rasmussenavid Kaiah Hosea, NP   Meds Ordered and Administered this Visit  Medications - No data to display  BP 119/78 (BP Location: Right Arm)   Pulse 76   Temp  98.1 F (36.7 C) (Oral)   Resp 16   Ht 5\' 2"  (1.575 m)   Wt 117 lb (53.1 kg)   LMP 08/24/2015 (Approximate)   SpO2 99%   BMI 21.40 kg/m  No data found.   Physical Exam  Constitutional: She is oriented to person, place, and time. She appears well-developed and well-nourished. No distress.  Eyes: EOM are normal.  Neck: Normal range of motion. Neck supple.  Cardiovascular: Normal rate.   Pulmonary/Chest: Effort normal. No respiratory distress.  Abdominal: Soft. Bowel sounds are normal. She exhibits no distension and no mass. There is no rebound and no guarding.  Minor tenderness over the mid suprapubic line.  Musculoskeletal: Normal range of motion. She exhibits no edema.  Neurological: She is alert and oriented to person, place, and time. She exhibits normal muscle tone.  Skin: Skin is warm and dry.  Psychiatric: She has a normal mood and affect.  Nursing note and vitals reviewed.   Urgent Care Course   Clinical Course    Procedures (including critical care time)  Labs Review Labs Reviewed  POCT URINALYSIS DIP (DEVICE) - Abnormal; Notable for the following:       Result Value   Hgb urine dipstick LARGE (*)    Leukocytes, UA MODERATE (*)    All other components within normal limits    Imaging Review No results found.   Visual Acuity Review  Right Eye Distance:   Left Eye Distance:  Bilateral Distance:    Right Eye Near:   Left Eye Near:    Bilateral Near:         MDM   1. UTI (lower urinary tract infection)    Meds ordered this encounter  Medications  . cephALEXin (KEFLEX) 500 MG capsule    Sig: Take 1 capsule (500 mg total) by mouth 4 (four) times daily.    Dispense:  28 capsule    Refill:  0    Order Specific Question:   Supervising Provider    Answer:   Linna HoffKINDL, JAMES D [[id number]]   Drink lots of water May take AZO Standard for urinary symptoms for 2 days as needed    Hayden Rasmussenavid Kortez Murtagh, NP 09/19/15 1458

## 2016-01-31 ENCOUNTER — Encounter (HOSPITAL_COMMUNITY): Payer: Self-pay

## 2016-01-31 ENCOUNTER — Emergency Department (HOSPITAL_COMMUNITY)
Admission: EM | Admit: 2016-01-31 | Discharge: 2016-01-31 | Disposition: A | Discharge status: Home / Self Care | Payer: Medicaid Other | Attending: Emergency Medicine | Admitting: Emergency Medicine

## 2016-01-31 ENCOUNTER — Emergency Department (HOSPITAL_COMMUNITY): Payer: Medicaid Other

## 2016-01-31 DIAGNOSIS — Y92410 Unspecified street and highway as the place of occurrence of the external cause: Secondary | ICD-10-CM | POA: Diagnosis not present

## 2016-01-31 DIAGNOSIS — Z87891 Personal history of nicotine dependence: Secondary | ICD-10-CM | POA: Diagnosis not present

## 2016-01-31 DIAGNOSIS — S199XXA Unspecified injury of neck, initial encounter: Secondary | ICD-10-CM | POA: Diagnosis present

## 2016-01-31 DIAGNOSIS — S161XXA Strain of muscle, fascia and tendon at neck level, initial encounter: Secondary | ICD-10-CM | POA: Diagnosis not present

## 2016-01-31 DIAGNOSIS — J45909 Unspecified asthma, uncomplicated: Secondary | ICD-10-CM | POA: Insufficient documentation

## 2016-01-31 DIAGNOSIS — Y939 Activity, unspecified: Secondary | ICD-10-CM | POA: Insufficient documentation

## 2016-01-31 DIAGNOSIS — Y999 Unspecified external cause status: Secondary | ICD-10-CM | POA: Diagnosis not present

## 2016-01-31 LAB — POC URINE PREG, ED: Preg Test, Ur: NEGATIVE

## 2016-01-31 MED ORDER — NAPROXEN 500 MG PO TABS: 500.0000 mg | ORAL_TABLET | Freq: Two times a day (BID) | ORAL | 0 refills | Status: DC

## 2016-01-31 MED ORDER — ACETAMINOPHEN 500 MG PO TABS
1000.0000 mg | ORAL_TABLET | Freq: Once | ORAL | Status: AC
  Administered 2016-01-31: 1000 mg via ORAL
  Filled 2016-01-31: qty 2

## 2016-01-31 MED ORDER — CYCLOBENZAPRINE HCL 10 MG PO TABS
10.0000 mg | ORAL_TABLET | Freq: Once | ORAL | Status: AC
Start: 2016-01-31 — End: 2016-01-31
  Administered 2016-01-31: 10 mg via ORAL
  Filled 2016-01-31: qty 1

## 2016-01-31 MED ORDER — IBUPROFEN 800 MG PO TABS
800.0000 mg | ORAL_TABLET | Freq: Once | ORAL | Status: AC
Start: 2016-01-31 — End: 2016-01-31
  Administered 2016-01-31: 800 mg via ORAL
  Filled 2016-01-31: qty 1

## 2016-01-31 MED ORDER — CYCLOBENZAPRINE HCL 10 MG PO TABS: 10.0000 mg | ORAL_TABLET | Freq: Two times a day (BID) | ORAL | 0 refills | Status: DC | PRN

## 2016-01-31 NOTE — Discharge Instructions (Signed)
Your x-rays today are normal. Please take Naprosyn twice daily. You may take Flexeril twice daily or only at night for muscle pain and stiffness. If needed, you may take 1000 mg of Tylenol every 6 hours. Ice your neck and back for 10-20 minutes 3 times daily. Turn to the emergency department for any new or concerning symptoms. Otherwise, please follow-up with your primary care physician.
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
## 2016-01-31 NOTE — ED Notes (Signed)
PT DISCHARGED. INSTRUCTIONS AND PRESCRIPTIONS GIVEN. AAOX4. PT IN NO APPARENT DISTRESS. THE OPPORTUNITY TO ASK QUESTIONS WAS PROVIDED. 

## 2016-03-28 ENCOUNTER — Ambulatory Visit: Payer: Medicaid Other | Admitting: Obstetrics and Gynecology

## 2016-05-20 ENCOUNTER — Emergency Department (HOSPITAL_COMMUNITY)
Admission: EM | Admit: 2016-05-20 | Discharge: 2016-05-20 | Disposition: A | Discharge status: Home / Self Care | Payer: Medicaid Other | Attending: Emergency Medicine | Admitting: Emergency Medicine

## 2016-05-20 DIAGNOSIS — N3000 Acute cystitis without hematuria: Secondary | ICD-10-CM | POA: Insufficient documentation

## 2016-05-20 DIAGNOSIS — Z87891 Personal history of nicotine dependence: Secondary | ICD-10-CM | POA: Diagnosis not present

## 2016-05-20 DIAGNOSIS — J45909 Unspecified asthma, uncomplicated: Secondary | ICD-10-CM | POA: Diagnosis not present

## 2016-05-20 DIAGNOSIS — Z79899 Other long term (current) drug therapy: Secondary | ICD-10-CM | POA: Insufficient documentation

## 2016-05-20 DIAGNOSIS — N390 Urinary tract infection, site not specified: Secondary | ICD-10-CM | POA: Diagnosis present

## 2016-05-20 LAB — URINALYSIS, ROUTINE W REFLEX MICROSCOPIC
BILIRUBIN URINE: NEGATIVE
GLUCOSE, UA: NEGATIVE mg/dL
HGB URINE DIPSTICK: NEGATIVE
KETONES UR: NEGATIVE mg/dL
NITRITE: NEGATIVE
PH: 5 (ref 5.0–8.0)
Protein, ur: NEGATIVE mg/dL
Specific Gravity, Urine: 1.019 (ref 1.005–1.030)

## 2016-05-20 LAB — POC URINE PREG, ED: Preg Test, Ur: NEGATIVE

## 2016-05-20 MED ORDER — SULFAMETHOXAZOLE-TRIMETHOPRIM 800-160 MG PO TABS
1.0000 | ORAL_TABLET | Freq: Once | ORAL | Status: AC
  Administered 2016-05-20: 1 via ORAL
  Filled 2016-05-20: qty 1

## 2016-05-20 MED ORDER — PHENAZOPYRIDINE HCL 200 MG PO TABS
200.0000 mg | ORAL_TABLET | Freq: Three times a day (TID) | ORAL | 0 refills | Status: DC
Start: 2016-05-20 — End: 2017-06-25

## 2016-05-20 MED ORDER — PHENAZOPYRIDINE HCL 200 MG PO TABS
200.0000 mg | ORAL_TABLET | Freq: Three times a day (TID) | ORAL | Status: DC
  Administered 2016-05-20: 200 mg via ORAL
  Filled 2016-05-20: qty 1

## 2016-05-20 MED ORDER — SULFAMETHOXAZOLE-TRIMETHOPRIM 800-160 MG PO TABS: 1.0000 | ORAL_TABLET | Freq: Two times a day (BID) | ORAL | 0 refills | Status: AC

## 2016-05-20 NOTE — ED Triage Notes (Signed)
Pt c/o Uti symtoms since Thursday. Took azo without relief endorses urinary frequency and pain.

## 2016-05-20 NOTE — ED Provider Notes (Signed)
[facility]-EMERGENCY DEPT Provider Note   CSN: [id number] Arrival date & time: 05/20/16  0610     History   Chief Complaint Chief Complaint  Patient presents with  . Urinary Tract Infection    HPI Karen Rojas is a 21 y.o. female.  Pt presents to the ED today with sx of an UTI.  The pt said she's had dysuria since Thursday, April 19th.  She has been taking OTC Azos without relief.  She has had UTIs in the past and this feels similar.      Past Medical History:  Diagnosis Date  . Asthma   . Urinary tract bacterial infections     Patient Active Problem List   Diagnosis Date Noted  . IUD check up 12/21/2014  . Chlamydia 09/23/2014    No past surgical history on file.  OB History    Gravida Para Term Preterm AB Living   0 0 0 0 0 0   SAB TAB Ectopic Multiple Live Births   0 0 0 0         Home Medications    Prior to Admission medications   Medication Sig Start Date End Date Taking? Authorizing Provider  cephALEXin (KEFLEX) 500 MG capsule Take 1 capsule (500 mg total) by mouth 4 (four) times daily. 09/19/15   Hayden Rasmussen, NP  cyclobenzaprine (FLEXERIL) 10 MG tablet Take 1 tablet (10 mg total) by mouth 2 (two) times daily as needed for muscle spasms. 01/31/16   [name], PA-C  naproxen (NAPROSYN) 500 MG tablet Take 1 tablet (500 mg total) by mouth 2 (two) times daily. 01/31/16   [name], PA-C  phenazopyridine (PYRIDIUM) 200 MG tablet Take 1 tablet (200 mg total) by mouth 3 (three) times daily. 05/20/16   Jacalyn Lefevre, MD  sulfamethoxazole-trimethoprim (BACTRIM DS,SEPTRA DS) 800-160 MG tablet Take 1 tablet by mouth 2 (two) times daily. 05/20/16 05/27/16  Jacalyn Lefevre, MD    Family History Family History  Problem Relation Age of Onset  . Kidney disease Mother     Social History Social History  Substance Use Topics  . Smoking status: Former Games developer  . Smokeless tobacco: Never Used  . Alcohol use No     Allergies   Patient has no known allergies.   Review  of Systems Review of Systems  Genitourinary: Positive for dysuria and frequency.  All other systems reviewed and are negative.    Physical Exam Updated Vital Signs BP 115/79   Pulse 67   Temp 97.9 F (36.6 C) (Oral)   Resp 17   SpO2 95%   Physical Exam  Constitutional: She is oriented to person, place, and time. She appears well-developed and well-nourished.  HENT:  Head: Normocephalic.  Neck: Normal range of motion.  Cardiovascular: Normal rate, regular rhythm, normal heart sounds and intact distal pulses.   Pulmonary/Chest: Effort normal and breath sounds normal.  Abdominal: Soft. Bowel sounds are normal. There is tenderness in the suprapubic area.  Neurological: She is alert and oriented to person, place, and time.  Nursing note and vitals reviewed.    ED Treatments / Results  Labs (all labs ordered are listed, but only abnormal results are displayed) Labs Reviewed  URINALYSIS, ROUTINE W REFLEX MICROSCOPIC - Abnormal; Notable for the following:       Result Value   APPearance CLOUDY (*)    Leukocytes, UA MODERATE (*)    Bacteria, UA RARE (*)    Squamous Epithelial / LPF 6-30 (*)    All  other components within normal limits  POC URINE PREG, ED    EKG  EKG Interpretation None       Radiology No results found.  Procedures Procedures (including critical care time)  Medications Ordered in ED Medications  sulfamethoxazole-trimethoprim (BACTRIM DS,SEPTRA DS) 800-160 MG per tablet 1 tablet (not administered)  phenazopyridine (PYRIDIUM) tablet 200 mg (not administered)     Initial Impression / Assessment and Plan / ED Course  I have reviewed the triage vital signs and the nursing notes.  Pertinent labs & imaging results that were available during my care of the patient were reviewed by me and considered in my medical decision making (see chart for details).    Pt told to return if worse.    Final Clinical Impressions(s) / ED Diagnoses   Final  diagnoses:  Acute cystitis without hematuria    New Prescriptions New Prescriptions   PHENAZOPYRIDINE (PYRIDIUM) 200 MG TABLET    Take 1 tablet (200 mg total) by mouth 3 (three) times daily.   SULFAMETHOXAZOLE-TRIMETHOPRIM (BACTRIM DS,SEPTRA DS) 800-160 MG TABLET    Take 1 tablet by mouth 2 (two) times daily.     Jacalyn Lefevre, MD 05/20/16 [phone]

## 2016-06-25 ENCOUNTER — Ambulatory Visit: Payer: Medicaid Other | Admitting: Obstetrics and Gynecology

## 2016-07-10 ENCOUNTER — Ambulatory Visit: Payer: Medicaid Other | Admitting: Obstetrics & Gynecology

## 2016-09-08 ENCOUNTER — Encounter (HOSPITAL_COMMUNITY): Payer: Self-pay | Admitting: Nurse Practitioner

## 2016-09-08 ENCOUNTER — Emergency Department (HOSPITAL_COMMUNITY)
Admission: EM | Admit: 2016-09-08 | Discharge: 2016-09-08 | Disposition: A | Discharge status: Home / Self Care | Payer: Medicaid Other | Attending: Emergency Medicine | Admitting: Emergency Medicine

## 2016-09-08 DIAGNOSIS — Z87891 Personal history of nicotine dependence: Secondary | ICD-10-CM | POA: Insufficient documentation

## 2016-09-08 DIAGNOSIS — B3749 Other urogenital candidiasis: Secondary | ICD-10-CM

## 2016-09-08 DIAGNOSIS — N390 Urinary tract infection, site not specified: Secondary | ICD-10-CM

## 2016-09-08 DIAGNOSIS — J45909 Unspecified asthma, uncomplicated: Secondary | ICD-10-CM | POA: Insufficient documentation

## 2016-09-08 LAB — URINALYSIS, ROUTINE W REFLEX MICROSCOPIC
Bilirubin Urine: NEGATIVE
Glucose, UA: NEGATIVE mg/dL
KETONES UR: NEGATIVE mg/dL
NITRITE: NEGATIVE
PROTEIN: NEGATIVE mg/dL
Specific Gravity, Urine: 1.01 (ref 1.005–1.030)
pH: 6 (ref 5.0–8.0)

## 2016-09-08 MED ORDER — FLUCONAZOLE 200 MG PO TABS: 200.0000 mg | ORAL_TABLET | Freq: Every day | ORAL | 0 refills | Status: DC

## 2016-09-08 MED ORDER — NITROFURANTOIN MONOHYD MACRO 100 MG PO CAPS: 100.0000 mg | ORAL_CAPSULE | Freq: Two times a day (BID) | ORAL | 0 refills | Status: DC

## 2016-09-08 MED ORDER — FLUCONAZOLE 50 MG PO TABS
50.0000 mg | ORAL_TABLET | Freq: Once | ORAL | Status: DC
  Filled 2016-09-08: qty 1

## 2016-09-08 MED ORDER — FLUCONAZOLE 200 MG PO TABS
200.0000 mg | ORAL_TABLET | Freq: Once | ORAL | Status: AC
  Administered 2016-09-08: 50 mg via ORAL
  Filled 2016-09-08: qty 1

## 2016-09-08 MED ORDER — NITROFURANTOIN MONOHYD MACRO 100 MG PO CAPS
100.0000 mg | ORAL_CAPSULE | Freq: Once | ORAL | Status: AC
  Administered 2016-09-08: 100 mg via ORAL
  Filled 2016-09-08: qty 1

## 2016-09-08 NOTE — ED Provider Notes (Signed)
[facility] DEPT Provider Note   CSN: [id number] Arrival date & time: 09/08/16  2039     History   Chief Complaint Chief Complaint  Patient presents with  . Recurrent UTI    HPI Karen Rojas is a 21 y.o. female.  This a 21 year old female with reported history of frequent UTIs.  States that she's had dysuria and frequency for the past week.  She is try drinking more water with some relief of her symptoms.  Denies any nausea, vomiting, fever, vaginal discharge, constipation or diarrhea. She does state that she is allergic to several antibiotics, but she doesn't know which one it causes some periorbital swelling and tingling.  She states she does noted.  The last antibiotic she received for UTI here.  She reacted to within several hours.      Past Medical History:  Diagnosis Date  . Asthma   . Urinary tract bacterial infections     Patient Active Problem List   Diagnosis Date Noted  . IUD check up 12/21/2014  . Chlamydia 09/23/2014    History reviewed. No pertinent surgical history.  OB History    Gravida Para Term Preterm AB Living   0 0 0 0 0 0   SAB TAB Ectopic Multiple Live Births   0 0 0 0         Home Medications    Prior to Admission medications   Medication Sig Start Date End Date Taking? Authorizing Provider  cephALEXin (KEFLEX) 500 MG capsule Take 1 capsule (500 mg total) by mouth 4 (four) times daily. Patient not taking: Reported on 09/08/2016 09/19/15   Hayden Rasmussen, NP  cyclobenzaprine (FLEXERIL) 10 MG tablet Take 1 tablet (10 mg total) by mouth 2 (two) times daily as needed for muscle spasms. Patient not taking: Reported on 09/08/2016 01/31/16   [name], PA-C  naproxen (NAPROSYN) 500 MG tablet Take 1 tablet (500 mg total) by mouth 2 (two) times daily. Patient not taking: Reported on 09/08/2016 01/31/16   [name], PA-C  nitrofurantoin, macrocrystal-monohydrate, (MACROBID) 100 MG capsule Take 1 capsule (100 mg total) by mouth 2 (two) times daily.  09/08/16   Earley Favor, NP  phenazopyridine (PYRIDIUM) 200 MG tablet Take 1 tablet (200 mg total) by mouth 3 (three) times daily. Patient not taking: Reported on 09/08/2016 05/20/16   Jacalyn Lefevre, MD    Family History Family History  Problem Relation Age of Onset  . Kidney disease Mother     Social History Social History  Substance Use Topics  . Smoking status: Former Games developer  . Smokeless tobacco: Never Used  . Alcohol use No     Allergies   Sulfa antibiotics   Review of Systems Review of Systems  Constitutional: Negative for fever.  Gastrointestinal: Negative for abdominal distention, abdominal pain, constipation, diarrhea, nausea and vomiting.  Genitourinary: Positive for dysuria and frequency. Negative for decreased urine volume, urgency, vaginal bleeding and vaginal discharge.  All other systems reviewed and are negative.    Physical Exam Updated Vital Signs BP 114/62 (BP Location: Left Arm)   Pulse 64   Temp 97.6 F (36.4 C) (Oral)   Resp 18   SpO2 100%   Physical Exam  Constitutional: She appears well-developed and well-nourished. No distress.  HENT:  Head: Normocephalic.  Eyes: Pupils are equal, round, and reactive to light.  Neck: Normal range of motion.  Cardiovascular: Normal rate.   Pulmonary/Chest: Effort normal and breath sounds normal.  Abdominal: Soft. Bowel sounds are normal. She  exhibits no distension. There is no tenderness.  Musculoskeletal: Normal range of motion.  Skin: Skin is warm and dry.  Psychiatric: She has a normal mood and affect.  Nursing note and vitals reviewed.    ED Treatments / Results  Labs (all labs ordered are listed, but only abnormal results are displayed) Labs Reviewed  URINALYSIS, ROUTINE W REFLEX MICROSCOPIC - Abnormal; Notable for the following:       Result Value   APPearance HAZY (*)    Hgb urine dipstick SMALL (*)    Leukocytes, UA LARGE (*)    Bacteria, UA RARE (*)    Squamous Epithelial / LPF 6-30  (*)    All other components within normal limits    EKG  EKG Interpretation None       Radiology No results found.  Procedures Procedures (including critical care time)  Medications Ordered in ED Medications  nitrofurantoin (macrocrystal-monohydrate) (MACROBID) capsule 100 mg (not administered)  fluconazole (DIFLUCAN) tablet 200 mg (not administered)     Initial Impression / Assessment and Plan / ED Course  I have reviewed the triage vital signs and the nursing notes.  Pertinent labs & imaging results that were available during my care of the patient were reviewed by me and considered in my medical decision making (see chart for details).      I did review patient's chart, it appears that she is allergic to sulfa about this to her known allergies to to her reported allergic reaction as a child.  I will avoid penicillins as well and treat her UTI with Macrobid as well as fluconazole because he states is present.  Final Clinical Impressions(s) / ED Diagnoses   Final diagnoses:  Lower urinary tract infectious disease  Candidal UTI (urinary tract infection)    New Prescriptions New Prescriptions   NITROFURANTOIN, MACROCRYSTAL-MONOHYDRATE, (MACROBID) 100 MG CAPSULE    Take 1 capsule (100 mg total) by mouth 2 (two) times daily.     Earley FavorSchulz, Falicia Lizotte, NP 09/08/16 2314    Derwood KaplanNanavati, Ankit, MD 09/10/16 0230

## 2016-09-08 NOTE — Discharge Instructions (Signed)
Your urine also shows yeast --you are being treated with both and antibiotic and antifungal  [facility] call and make an appointment to establish primary medical care

## 2016-09-08 NOTE — ED Triage Notes (Signed)
Pt states she has a UTI, adding the she gets them often and the symptoms she is experiencing are typical of a UTI. Denies flank pain, fever or chills.

## 2016-09-08 NOTE — ED Notes (Signed)
Pt has declined lab blood work draw, stating "I get UTI's all the time, please just check the urine."

## 2017-06-25 ENCOUNTER — Encounter (HOSPITAL_COMMUNITY): Payer: Self-pay

## 2017-06-25 ENCOUNTER — Emergency Department (HOSPITAL_COMMUNITY)
Admission: EM | Admit: 2017-06-25 | Discharge: 2017-06-25 | Disposition: A | Discharge status: Home / Self Care | Payer: Self-pay | Attending: Emergency Medicine | Admitting: Emergency Medicine

## 2017-06-25 DIAGNOSIS — J45909 Unspecified asthma, uncomplicated: Secondary | ICD-10-CM | POA: Insufficient documentation

## 2017-06-25 DIAGNOSIS — N3090 Cystitis, unspecified without hematuria: Secondary | ICD-10-CM | POA: Insufficient documentation

## 2017-06-25 DIAGNOSIS — Z79899 Other long term (current) drug therapy: Secondary | ICD-10-CM | POA: Insufficient documentation

## 2017-06-25 DIAGNOSIS — R3915 Urgency of urination: Secondary | ICD-10-CM | POA: Insufficient documentation

## 2017-06-25 DIAGNOSIS — Z87891 Personal history of nicotine dependence: Secondary | ICD-10-CM | POA: Insufficient documentation

## 2017-06-25 LAB — URINALYSIS, ROUTINE W REFLEX MICROSCOPIC
BILIRUBIN URINE: NEGATIVE
Glucose, UA: NEGATIVE mg/dL
KETONES UR: NEGATIVE mg/dL
Nitrite: NEGATIVE
PROTEIN: NEGATIVE mg/dL
Specific Gravity, Urine: 1.009 (ref 1.005–1.030)
pH: 9 — ABNORMAL HIGH (ref 5.0–8.0)

## 2017-06-25 LAB — PREGNANCY, URINE: Preg Test, Ur: NEGATIVE

## 2017-06-25 MED ORDER — CEPHALEXIN 500 MG PO CAPS: 500.0000 mg | ORAL_CAPSULE | Freq: Two times a day (BID) | ORAL | 0 refills | Status: DC

## 2017-06-25 NOTE — Discharge Instructions (Signed)
You have a urinary tract infection.  Please take antibiotic twice a day for the next week.  It is important that you take the full course of antibiotics.  Return to the ER if you have any new or concerning symptoms like fever greater than 100.4 F, vomiting, worsening abdominal pain.

## 2017-06-25 NOTE — ED Provider Notes (Signed)
[facility]-EMERGENCY DEPT Provider Note   CSN: [id number] Arrival date & time: 06/25/17  1843     History   Chief Complaint No chief complaint on file.   HPI Karen Rojas is a 22 y.o. female.  HPI  Karen Rojas is a 22 year old female with no significant past medical history presents the emergency department for evaluation of UTI.  She states that she has had urinary frequency, urgency and dysuria for the past 2 days now.  States her symptoms are similar to prior UTIs.  She has been taking vitamin C and drinking plenty of water without improvement.  She denies fevers, chills, abdominal pain, flank pain, nausea/vomiting, vaginal discharge, vaginal bleeding.  She reports no concern for STDs.  Reports she is allergic to sulfa antibiotics.  Past Medical History:  Diagnosis Date  . Asthma   . Urinary tract bacterial infections     Patient Active Problem List   Diagnosis Date Noted  . IUD check up 12/21/2014  . Chlamydia 09/23/2014    History reviewed. No pertinent surgical history.   OB History    Gravida  0   Para  0   Term  0   Preterm  0   AB  0   Living  0     SAB  0   TAB  0   Ectopic  0   Multiple  0   Live Births               Home Medications    Prior to Admission medications   Medication Sig Start Date End Date Taking? Authorizing Provider  ibuprofen (ADVIL,MOTRIN) 200 MG tablet Take 200 mg by mouth every 6 (six) hours as needed for moderate pain.   Yes [provider]  URINARY HEALTH/CRANBERRY PO Take 4 tablets by mouth daily.   Yes [provider]  cyclobenzaprine (FLEXERIL) 10 MG tablet Take 1 tablet (10 mg total) by mouth 2 (two) times daily as needed for muscle spasms. Patient not taking: Reported on 09/08/2016 01/31/16   [name], PA-C  naproxen (NAPROSYN) 500 MG tablet Take 1 tablet (500 mg total) by mouth 2 (two) times daily. Patient not taking: Reported on 09/08/2016 01/31/16   [name], PA-C    medroxyPROGESTERone (DEPO-PROVERA) 150 MG/ML injection Inject 1 mL (150 mg total) into the muscle every 3 (three) months. Patient not taking: Reported on 02/22/2015 01/13/15 05/29/15  Roe Coombs, CNM    Family History Family History  Problem Relation Age of Onset  . Kidney disease Mother     Social History Social History   Tobacco Use  . Smoking status: Former Games developer  . Smokeless tobacco: Never Used  Substance Use Topics  . Alcohol use: No    Alcohol/week: 0.0 oz  . Drug use: Yes    Types: Marijuana    Comment: occasional     Allergies   Sulfa antibiotics   Review of Systems Review of Systems  Constitutional: Negative for chills and fever.  Gastrointestinal: Negative for abdominal pain, nausea and vomiting.  Genitourinary: Positive for dysuria and frequency. Negative for flank pain, hematuria, menstrual problem, vaginal bleeding and vaginal discharge.  Musculoskeletal: Negative for back pain.  All other systems reviewed and are negative.    Physical Exam Updated Vital Signs BP 120/70 (BP Location: Right Arm)   Pulse 70   Temp 98.7 F (37.1 C) (Oral)   Resp 17   LMP 06/07/2017   SpO2 99%   Physical Exam  Constitutional: She is oriented to person, place, and time. She appears well-developed and well-nourished. No distress.  HENT:  Head: Normocephalic and atraumatic.  Mouth/Throat: Oropharynx is clear and moist. No oropharyngeal exudate.  Eyes: Pupils are equal, round, and reactive to light. Right eye exhibits no discharge. Left eye exhibits no discharge.  Neck: Normal range of motion. Neck supple.  Cardiovascular: Normal rate, regular rhythm and intact distal pulses.  Pulmonary/Chest: Effort normal and breath sounds normal. No stridor. No respiratory distress. She has no wheezes. She has no rales.  Abdominal: Soft. Bowel sounds are normal. There is no tenderness.  No CVA tenderness.  Musculoskeletal: Normal range of motion.  Neurological: She is alert  and oriented to person, place, and time. Coordination normal.  Skin: Skin is warm and dry. She is not diaphoretic.  Psychiatric: She has a normal mood and affect. Her behavior is normal.  Nursing note and vitals reviewed.    ED Treatments / Results  Labs (all labs ordered are listed, but only abnormal results are displayed) Labs Reviewed  URINALYSIS, ROUTINE W REFLEX MICROSCOPIC - Abnormal; Notable for the following components:      Result Value   Color, Urine STRAW (*)    pH 9.0 (*)    Hgb urine dipstick SMALL (*)    Leukocytes, UA LARGE (*)    Bacteria, UA RARE (*)    All other components within normal limits  PREGNANCY, URINE    EKG None  Radiology No results found.  Procedures Procedures (including critical care time)  Medications Ordered in ED Medications - No data to display   Initial Impression / Assessment and Plan / ED Course  I have reviewed the triage vital signs and the nursing notes.  Pertinent labs & imaging results that were available during my care of the patient were reviewed by me and considered in my medical decision making (see chart for details).     Pt has been diagnosed with a UTI. Pt is afebrile, no CVA tenderness, normotensive, and denies N/V.  No concern for pyelonephritis.  Pt to be dc home with antibiotics and instructions to follow up with PCP if symptoms persist.  Discussed return precautions and she agrees.   Final Clinical Impressions(s) / ED Diagnoses   Final diagnoses:  Cystitis    ED Discharge Orders        Ordered    cephALEXin (KEFLEX) 500 MG capsule  2 times daily     06/25/17 2207       Kellie Shropshire, PA-C 06/25/17 2209    Charlynne Pander, MD 06/26/17 2325

## 2017-06-25 NOTE — ED Triage Notes (Signed)
Pt complains of UTI symptoms for two days, no other complaints
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
## 2019-01-29 NOTE — L&D Delivery Note (Signed)
  Patient: Karen Rojas MRN: [id number]  GBS status: Negative, IAP given (Amp + Gent maternal temp of 100.4)  Patient is a 24 y.o. now G1P1001 s/p NSVD at [redacted]w[redacted]d, who was admitted for IOL 2/2 PD. SROM 10h 62m prior to delivery with clear fluid.    Delivery Note At 10:16 AM a viable female was delivered via  (Presentation: ROA).  APGAR & weight see delivery summary.   Placenta status: spontaneous, intact.  Cord: 3 vessels with the following complications: short cord.  Cord pH: gases not collected  Head delivered ROA. Thick meconium presented at restitution. No nuchal cord present. Shoulder and body delivered in usual fashion. Infant with spontaneous cry, placed on mother's abdomen, dried and bulb suctioned. Cord clamped x 2 after 1-minute delay, and cut by family member. Cord blood drawn. Placenta delivered spontaneously with gentle cord traction. Fundus firm with massage and Pitocin. Perineum inspected and found to have 1st degree right and left periurethral lacerations, which was found to be hemostatic.  Anesthesia: Epidural Episiotomy: N/a Lacerations: Right and left 1st degree periurethral Suture Repair: n/a Est. Blood Loss (mL): 15  Mom to postpartum.  Baby to Couplet care / Skin to Skin.  Evelyn Aguinaldo Autry-Lott 12/28/2019, 10:32 AM

## 2019-05-10 ENCOUNTER — Emergency Department (HOSPITAL_COMMUNITY)
Admission: EM | Admit: 2019-05-10 | Discharge: 2019-05-10 | Disposition: A | Discharge status: Home / Self Care | Payer: Medicaid Other | Attending: Emergency Medicine | Admitting: Emergency Medicine

## 2019-05-10 ENCOUNTER — Other Ambulatory Visit: Payer: Self-pay

## 2019-05-10 ENCOUNTER — Encounter (HOSPITAL_COMMUNITY): Payer: Self-pay | Admitting: Emergency Medicine

## 2019-05-10 DIAGNOSIS — Z3A13 13 weeks gestation of pregnancy: Secondary | ICD-10-CM | POA: Diagnosis not present

## 2019-05-10 DIAGNOSIS — O26899 Other specified pregnancy related conditions, unspecified trimester: Secondary | ICD-10-CM | POA: Diagnosis not present

## 2019-05-10 DIAGNOSIS — Z3401 Encounter for supervision of normal first pregnancy, first trimester: Secondary | ICD-10-CM | POA: Insufficient documentation

## 2019-05-10 DIAGNOSIS — Z3201 Encounter for pregnancy test, result positive: Secondary | ICD-10-CM | POA: Insufficient documentation

## 2019-05-10 DIAGNOSIS — M545 Low back pain: Secondary | ICD-10-CM | POA: Diagnosis not present

## 2019-05-10 DIAGNOSIS — O99320 Drug use complicating pregnancy, unspecified trimester: Secondary | ICD-10-CM | POA: Insufficient documentation

## 2019-05-10 DIAGNOSIS — N3000 Acute cystitis without hematuria: Secondary | ICD-10-CM

## 2019-05-10 DIAGNOSIS — O2310 Infections of bladder in pregnancy, unspecified trimester: Secondary | ICD-10-CM | POA: Insufficient documentation

## 2019-05-10 DIAGNOSIS — J45909 Unspecified asthma, uncomplicated: Secondary | ICD-10-CM | POA: Diagnosis not present

## 2019-05-10 DIAGNOSIS — O2340 Unspecified infection of urinary tract in pregnancy, unspecified trimester: Secondary | ICD-10-CM | POA: Diagnosis present

## 2019-05-10 DIAGNOSIS — Z87891 Personal history of nicotine dependence: Secondary | ICD-10-CM | POA: Insufficient documentation

## 2019-05-10 DIAGNOSIS — F1210 Cannabis abuse, uncomplicated: Secondary | ICD-10-CM | POA: Insufficient documentation

## 2019-05-10 DIAGNOSIS — Z3490 Encounter for supervision of normal pregnancy, unspecified, unspecified trimester: Secondary | ICD-10-CM

## 2019-05-10 LAB — URINALYSIS, ROUTINE W REFLEX MICROSCOPIC
Bilirubin Urine: NEGATIVE
Glucose, UA: NEGATIVE mg/dL
Ketones, ur: NEGATIVE mg/dL
Nitrite: NEGATIVE
Protein, ur: NEGATIVE mg/dL
Specific Gravity, Urine: 1 — ABNORMAL LOW (ref 1.005–1.030)
pH: 7 (ref 5.0–8.0)

## 2019-05-10 LAB — I-STAT CHEM 8, ED
BUN: 6 mg/dL (ref 6–20)
Calcium, Ion: 1.3 mmol/L (ref 1.15–1.40)
Chloride: 100 mmol/L (ref 98–111)
Creatinine, Ser: 0.6 mg/dL (ref 0.44–1.00)
Glucose, Bld: 82 mg/dL (ref 70–99)
HCT: 38 % (ref 36.0–46.0)
Hemoglobin: 12.9 g/dL (ref 12.0–15.0)
Potassium: 3.3 mmol/L — ABNORMAL LOW (ref 3.5–5.1)
Sodium: 136 mmol/L (ref 135–145)
TCO2: 26 mmol/L (ref 22–32)

## 2019-05-10 LAB — I-STAT BETA HCG BLOOD, ED (MC, WL, AP ONLY): I-stat hCG, quantitative: >2000 m[IU]/mL — ABNORMAL HIGH (ref <5)

## 2019-05-10 MED ORDER — CEPHALEXIN 500 MG PO CAPS: 500.0000 mg | ORAL_CAPSULE | Freq: Two times a day (BID) | ORAL | 0 refills | Status: DC

## 2019-05-10 MED ORDER — PRENATAL COMPLETE 14-0.4 MG PO TABS: 1.0000 | ORAL_TABLET | Freq: Every day | ORAL | 1 refills | Status: DC

## 2019-05-10 NOTE — Discharge Instructions (Signed)
Get help right away if you have:  Severe pain in your back or your lower abdomen.  A fever.  Nausea or vomiting.

## 2019-05-10 NOTE — ED Triage Notes (Signed)
Pt c/o left back pains that started yesterday, dysuria started this morning. Hx recurrent UTIs. Thinks she is pregnant. Last menstrual cycle was Feb.

## 2019-05-10 NOTE — ED Notes (Addendum)
Patient refusing blood work at this time and requesting to use the urine sample for pregnancy test.

## 2019-05-10 NOTE — ED Provider Notes (Signed)
[facility]-EMERGENCY DEPT Provider Note   CSN: [id number] Arrival date & time: 05/10/19  0800     History Chief Complaint  Patient presents with  . Dysuria  . Back Pain    left    Karen Rojas is a 24 y.o. female who presents with dysuria. Patient states that 2 days ago she began having pain in her left lower back.  Yesterday she noticed some burning with urination and a little bit of blood in her urine.  She denies fevers or chills.  She is also concerned that she might be pregnant as her last menstrual period was January 8.  HPI     Past Medical History:  Diagnosis Date  . Asthma   . Urinary tract bacterial infections     Patient Active Problem List   Diagnosis Date Noted  . IUD check up 12/21/2014  . Chlamydia 09/23/2014    History reviewed. No pertinent surgical history.   OB History    Gravida  0   Para  0   Term  0   Preterm  0   AB  0   Living  0     SAB  0   TAB  0   Ectopic  0   Multiple  0   Live Births              Family History  Problem Relation Age of Onset  . Kidney disease Mother     Social History   Tobacco Use  . Smoking status: Former Games developer  . Smokeless tobacco: Never Used  Substance Use Topics  . Alcohol use: No    Alcohol/week: 0.0 standard drinks  . Drug use: Yes    Types: Marijuana    Comment: occasional    Home Medications Prior to Admission medications   Medication Sig Start Date End Date Taking? Authorizing Provider  cephALEXin (KEFLEX) 500 MG capsule Take 1 capsule (500 mg total) by mouth 2 (two) times daily. 06/25/17   Kellie Shropshire, PA-C  cyclobenzaprine (FLEXERIL) 10 MG tablet Take 1 tablet (10 mg total) by mouth 2 (two) times daily as needed for muscle spasms. Patient not taking: Reported on 09/08/2016 01/31/16   [name], PA-C  ibuprofen (ADVIL,MOTRIN) 200 MG tablet Take 200 mg by mouth every 6 (six) hours as needed for moderate pain.    [provider]    naproxen (NAPROSYN) 500 MG tablet Take 1 tablet (500 mg total) by mouth 2 (two) times daily. Patient not taking: Reported on 09/08/2016 01/31/16   [name], PA-C  URINARY HEALTH/CRANBERRY PO Take 4 tablets by mouth daily.    [provider]    Allergies    Sulfa antibiotics  Review of Systems   Review of Systems Ten systems reviewed and are negative for acute change, except as noted in the HPI.   Physical Exam Updated Vital Signs BP 122/73 (BP Location: Left Arm)   Pulse 72   Temp 98 F (36.7 C) (Oral)   Resp 16   Ht 5\' 1"  (1.549 m)   Wt 61.2 kg   SpO2 100%   BMI 25.51 kg/m   Physical Exam Vitals and nursing note reviewed.  Constitutional:      General: She is not in acute distress.    Appearance: She is well-developed. She is not diaphoretic.  HENT:     Head: Normocephalic and atraumatic.  Eyes:     General: No scleral icterus.    Conjunctiva/sclera: Conjunctivae  normal.  Cardiovascular:     Rate and Rhythm: Normal rate and regular rhythm.     Heart sounds: Normal heart sounds. No murmur. No friction rub. No gallop.   Pulmonary:     Effort: Pulmonary effort is normal. No respiratory distress.     Breath sounds: Normal breath sounds.  Abdominal:     General: Bowel sounds are normal. There is no distension.     Palpations: Abdomen is soft. There is no mass.     Tenderness: There is no abdominal tenderness. There is no right CVA tenderness, left CVA tenderness or guarding.  Musculoskeletal:     Cervical back: Normal range of motion.  Skin:    General: Skin is warm and dry.  Neurological:     Mental Status: She is alert and oriented to person, place, and time.  Psychiatric:        Behavior: Behavior normal.     ED Results / Procedures / Treatments   Labs (all labs ordered are listed, but only abnormal results are displayed) Labs Reviewed  URINALYSIS, ROUTINE W REFLEX MICROSCOPIC - Abnormal; Notable for the following components:      Result Value    Color, Urine STRAW (*)    Specific Gravity, Urine 1.000 (*)    Hgb urine dipstick MODERATE (*)    Leukocytes,Ua MODERATE (*)    Bacteria, UA RARE (*)    All other components within normal limits  I-STAT BETA HCG BLOOD, ED ([facility], [facility], [facility] ONLY) - Abnormal; Notable for the following components:   I-stat hCG, quantitative >2,000.0 (*)    All other components within normal limits  I-STAT CHEM 8, ED - Abnormal; Notable for the following components:   Potassium 3.3 (*)    All other components within normal limits    EKG None  Radiology No results found.  Procedures Procedures (including critical care time)  Medications Ordered in ED Medications - No data to display  ED Course  I have reviewed the triage vital signs and the nursing notes.  Pertinent labs & imaging results that were available during my care of the patient were reviewed by me and considered in my medical decision making (see chart for details).    MDM Rules/Calculators/A&P                      Patient with positive pregnancy tes est 13w 3d based on LMP. I personally ordered and reviewed the patient's labs.  On my interpretation the patient appears to have a urinary tract infection.  Her point-of-care urine is positive.  Patient's Chem-8 shows mild hypokalemia of insignificant value without evidence of elevated creatinine.  Patient be discharged with Keflex, she was given a pregnancy confirmation letter.  She was appropriate for discharge at this time Final Clinical Impression(s) / ED Diagnoses Final diagnoses:  None    Rx / DC Orders ED Discharge Orders    None       Margarita Mail, PA-C 05/10/19 Daisy, Campbell, DO 05/10/19 1138

## 2019-05-18 ENCOUNTER — Other Ambulatory Visit (HOSPITAL_COMMUNITY)
Admission: RE | Admit: 2019-05-18 | Discharge: 2019-05-18 | Disposition: A | Discharge status: Home / Self Care | Payer: Medicaid Other | Source: Ambulatory Visit | Attending: Obstetrics and Gynecology | Admitting: Obstetrics and Gynecology

## 2019-05-18 ENCOUNTER — Ambulatory Visit (INDEPENDENT_AMBULATORY_CARE_PROVIDER_SITE_OTHER): Payer: Medicaid Other | Admitting: *Deleted

## 2019-05-18 ENCOUNTER — Other Ambulatory Visit: Payer: Self-pay

## 2019-05-18 VITALS — BP 118/75 | HR 78 | Temp 97.7°F | Wt 137.6 lb

## 2019-05-18 DIAGNOSIS — Z3400 Encounter for supervision of normal first pregnancy, unspecified trimester: Secondary | ICD-10-CM | POA: Insufficient documentation

## 2019-05-18 DIAGNOSIS — O26899 Other specified pregnancy related conditions, unspecified trimester: Secondary | ICD-10-CM | POA: Insufficient documentation

## 2019-05-18 DIAGNOSIS — N898 Other specified noninflammatory disorders of vagina: Secondary | ICD-10-CM | POA: Diagnosis present

## 2019-05-18 DIAGNOSIS — Z3490 Encounter for supervision of normal pregnancy, unspecified, unspecified trimester: Secondary | ICD-10-CM

## 2019-05-18 NOTE — Patient Instructions (Addendum)
Genetic Screening Results Information: You are having genetic testing called [facility] today.  It will take approximately 2 weeks before the results are available.  To get your results, you need Internet access to a web browser to search [facility]/MyChart (the direct app on your phone will not give you these results).  Then select Lab Scanned and click on the blue hyper link that says View Image to see your [facility] results.  You can also use the directions on the purple card given to look up your results directly on the [facility] website. Second Trimester of Pregnancy  The second trimester is from week 14 through week 27 (month 4 through 6). This is often the time in pregnancy that you feel your best. Often times, morning sickness has lessened or quit. You may have more energy, and you may get hungry more often. Your unborn baby is growing rapidly. At the end of the sixth month, he or she is about 9 inches long and weighs about 1 pounds. You will likely feel the baby move between 18 and 20 weeks of pregnancy. Follow these instructions at home: Medicines  Take over-the-counter and prescription medicines only as told by your doctor. Some medicines are safe and some medicines are not safe during pregnancy.  Take a prenatal vitamin that contains at least 600 micrograms (mcg) of folic acid.  If you have trouble pooping (constipation), take medicine that will make your stool soft (stool softener) if your doctor approves. Eating and drinking   Eat regular, healthy meals.  Avoid raw meat and uncooked cheese.  If you get low calcium from the food you eat, talk to your doctor about taking a daily calcium supplement.  Avoid foods that are high in fat and sugars, such as fried and sweet foods.  If you feel sick to your stomach (nauseous) or throw up (vomit): ? Eat 4 or 5 small meals a day instead of 3 large meals. ? Try eating a few soda crackers. ? Drink liquids between meals instead of during  meals.  To prevent constipation: ? Eat foods that are high in fiber, like fresh fruits and vegetables, whole grains, and beans. ? Drink enough fluids to keep your pee (urine) clear or pale yellow. Activity  Exercise only as told by your doctor. Stop exercising if you start to have cramps.  Do not exercise if it is too hot, too humid, or if you are in a place of great height (high altitude).  Avoid heavy lifting.  Wear low-heeled shoes. Sit and stand up straight.  You can continue to have sex unless your doctor tells you not to. Relieving pain and discomfort  Wear a good support bra if your breasts are tender.  Take warm water baths (sitz baths) to soothe pain or discomfort caused by hemorrhoids. Use hemorrhoid cream if your doctor approves.  Rest with your legs raised if you have leg cramps or low back pain.  If you develop puffy, bulging veins (varicose veins) in your legs: ? Wear support hose or compression stockings as told by your doctor. ? Raise (elevate) your feet for 15 minutes, 3-4 times a day. ? Limit salt in your food. Prenatal care  Write down your questions. Take them to your prenatal visits.  Keep all your prenatal visits as told by your doctor. This is important. Safety  Wear your seat belt when driving.  Make a list of emergency phone numbers, including numbers for family, friends, the hospital, and police and fire departments.  fire departments. General instructions  Ask your doctor about the right foods to eat or for help finding a counselor, if you need these services.  Ask your doctor about local prenatal classes. Begin classes before month 6 of your pregnancy.  Do not use hot tubs, steam rooms, or saunas.  Do not douche or use tampons or scented sanitary pads.  Do not cross your legs for long periods of time.  Visit your dentist if you have not done so. Use a soft toothbrush to brush your teeth. Floss gently.  Avoid all smoking, herbs, and alcohol. Avoid drugs  that are not approved by your doctor.  Do not use any products that contain nicotine or tobacco, such as cigarettes and e-cigarettes. If you need help quitting, ask your doctor.  Avoid cat litter boxes and soil used by cats. These carry germs that can cause birth defects in the baby and can cause a loss of your baby (miscarriage) or stillbirth. Contact a doctor if:  You have mild cramps or pressure in your lower belly.  You have pain when you pee (urinate).  You have bad smelling fluid coming from your vagina.  You continue to feel sick to your stomach (nauseous), throw up (vomit), or have watery poop (diarrhea).  You have a nagging pain in your belly area.  You feel dizzy. Get help right away if:  You have a fever.  You are leaking fluid from your vagina.  You have spotting or bleeding from your vagina.  You have severe belly cramping or pain.  You lose or gain weight rapidly.  You have trouble catching your breath and have chest pain.  You notice sudden or extreme puffiness (swelling) of your face, hands, ankles, feet, or legs.  You have not felt the baby move in over an hour.  You have severe headaches that do not go away when you take medicine.  You have trouble seeing. Summary  The second trimester is from week 14 through week 27 (months 4 through 6). This is often the time in pregnancy that you feel your best.  To take care of yourself and your unborn baby, you will need to eat healthy meals, take medicines only if your doctor tells you to do so, and do activities that are safe for you and your baby.  Call your doctor if you get sick or if you notice anything unusual about your pregnancy. Also, call your doctor if you need help with the right food to eat, or if you want to know what activities are safe for you. This information is not intended to replace advice given to you by your health care provider. Make sure you discuss any questions you have with your health  care provider. Document Revised: 05/08/2018 Document Reviewed: 02/20/2016 Elsevier Patient Education  2020 [employer]  Genetic Testing During Pregnancy Genetic testing during pregnancy is also called prenatal genetic testing. This type of testing can determine if your baby is at risk of being born with a disorder caused by abnormal genes or chromosomes (genetic disorder). Chromosomes contain genes that control how your baby will develop in your womb. There are many different genetic disorders. Examples of genetic disorders that may be found through genetic testing include Down syndrome and cystic fibrosis. Gene changes (mutations) can be passed down through families. Genetic testing is offered to all women before or during pregnancy. You can choose whether to have genetic testing. Why is genetic testing done? Genetic testing is done during pregnancy   to find out whether your child is at risk for a genetic disorder. Having genetic testing allows you to:  Discuss your test results and options with a genetic counselor.  Prepare for a baby that may be born with a genetic disorder. Learning about the disorder ahead of time helps you be better prepared to manage it. Your health care providers can also be prepared in case your baby requires special care before or after birth.  Consider whether you want to continue with the pregnancy. In some cases, genetic testing may be done to learn about the traits a child will inherit. Types of genetic tests There are two basic types of genetic testing. Screening tests indicate whether your developing baby (fetus) is at higher risk for a genetic disorder. Diagnostic tests check actual fetal cells to diagnose a genetic disorder. Screening tests     Screening tests will not harm your baby. They are recommended for all pregnant women. Types of screening tests include:  Carrier screening. This test involves checking genes from both parents by testing their blood  or saliva. The test checks to find out if the parents carry a genetic mutation that may be passed to a baby. In most cases, both parents must carry the mutation for a baby to be at risk.  First trimester screening. This test combines a blood test with sound wave imaging of your baby (fetal ultrasound). This screening test checks for a risk of Down syndrome or other defects caused by having extra chromosomes. It also checks for defects of the heart, abdomen, or skeleton.  Second trimester screening also combines a blood test with a fetal ultrasound exam. It checks for a risk of genetic defects of the face, brain, spine, heart, or limbs.  Combined or sequential screening. This type of testing combines the results of first and second trimester screening. This type of testing may be more accurate than first or second trimester screening alone.  Cell-free DNA testing. This is a blood test that detects cells released by the placenta that get into the mother's blood. It can be used to check for a risk of Down syndrome, other extra chromosome syndromes, and disorders caused by abnormal numbers of sex chromosomes. This test can be done any time after 10 weeks of pregnancy.  Diagnostic tests Diagnostic tests carry slight risks of problems, including bleeding, infection, and loss of the pregnancy. These tests are done only if your baby is at risk for a genetic disorder. You may meet with a genetic counselor to discuss the risks and benefits before having diagnostic tests. Examples of diagnostic tests include:  Chorionic villus sampling (CVS). This involves a procedure to remove and test a sample of cells taken from the placenta. The procedure may be done between 10 and 12 weeks of pregnancy.  Amniocentesis. This involves a procedure to remove and test a sample of fluid (amniotic fluid) and cells from the sac that surrounds the developing baby. The procedure may be done between 15 and 20 weeks of pregnancy. What  do the results mean? For a screening test:  If the results are negative, it often means that your child is not at higher risk. There is still a slight chance your child could have a genetic disorder.  If the results are positive, it does not mean your child will have a genetic disorder. It may mean that your child has a higher-than-normal risk for a genetic disorder. In that case, you may want to talk with a genetic   counselor about whether you should have diagnostic genetic tests. For a diagnostic test:  If the result is negative, it is unlikely that your child will have a genetic disorder.  If the test is positive for a genetic disorder, it is likely that your child will have the disorder. The test may not tell how severe the disorder will be. Talk with your health care provider about your options. Questions to ask your health care provider Before talking to your health care provider about genetic testing, find out if there is a history of genetic disorders in your family. It may also help to know your family's ethnic origins. Then ask your health care provider the following questions:  Is my baby at risk for a genetic disorder?  What are the benefits of having genetic screening?  What tests are best for me and my baby?  What are the risks of each test?  If I get a positive result on a screening test, what is the next step?  Should I meet with a genetic counselor before having a diagnostic test?  Should my partner or other members of my family be tested?  How much do the tests cost? Will my insurance cover the testing? Summary  Genetic testing is done during pregnancy to find out whether your child is at risk for a genetic disorder.  Genetic testing is offered to all women before or during pregnancy. You can choose whether to have genetic testing.  There are two basic types of genetic testing. Screening tests indicate whether your developing baby (fetus) is at higher risk for a  genetic disorder. Diagnostic tests check actual fetal cells to diagnose a genetic disorder.  If a diagnostic genetic test is positive, talk with your health care provider about your options. This information is not intended to replace advice given to you by your health care provider. Make sure you discuss any questions you have with your health care provider. Document Revised: 05/07/2018 Document Reviewed: 03/31/2017 Elsevier Patient Education  2020 [employer] Inc.  Warning Signs During Pregnancy A pregnancy lasts about 40 weeks, starting from the first day of your last period until the baby is born. Pregnancy is divided into three phases called trimesters.  The first trimester refers to week 1 through week 13 of pregnancy.  The second trimester is the start of week 14 through the end of week 27.  The third trimester is the start of week 28 until you deliver your baby. During each trimester of pregnancy, certain signs and symptoms may indicate a problem. Talk with your health care provider about your current health and any medical conditions you have. Make sure you know the symptoms that you should watch for and report. How does this affect me?  Warning signs in the first trimester While some changes during the first trimester may be uncomfortable, most do not represent a serious problem. Let your health care provider know if you have any of the following warning signs in the first trimester:  You cannot eat or drink without vomiting, and this lasts for longer than a day.  You have vaginal bleeding or spotting along with menstrual-like cramping.  You have diarrhea for longer than a day.  You have a fever or other signs of infection, such as: ? Pain or burning when you urinate. ? Foul smelling or thick or yellowish vaginal discharge. Warning signs in the second trimester As your baby grows and changes during the second trimester, there are additional signs and symptoms   that may indicate  a problem. These include:  Signs and symptoms of infection, including a fever.  Signs or symptoms of a miscarriage or preterm labor, such as regular contractions, menstrual-like cramping, or lower abdominal pain.  Bloody or watery vaginal discharge or obvious vaginal bleeding.  Feeling like your heart is pounding.  Having trouble breathing.  Nausea, vomiting, or diarrhea that lasts for longer than a day.  Craving non-food items, such as clay, chalk, or dirt. This may be a sign of a very treatable medical condition called pica. Later in your second trimester, watch for signs and symptoms of a serious medical condition called preeclampsia.These include:  Changes in your vision.  A severe headache that does not go away.  Nausea and vomiting. It is also important to notice if your baby stops moving or moves less than usual during this time. Warning signs in the third trimester As you approach the third trimester, your baby is growing and your body is preparing for the birth of your baby. In your third trimester, be sure to let your health care provider know if:  You have signs and symptoms of infection, including a fever.  You have vaginal bleeding.  You notice that your baby is moving less than usual or is not moving.  You have nausea, vomiting, or diarrhea that lasts for longer than a day.  You have a severe headache that does not go away.  You have vision changes, including seeing spots or having blurry or double vision.  You have increased swelling in your hands or face. How does this affect my baby? Throughout your pregnancy, always report any of the warning signs of a problem to your health care provider. This can help prevent complications that may affect your baby, including:  Increased risk for premature birth.  Infection that may be transmitted to your baby.  Increased risk for stillbirth. Contact a health care provider if:  You have any of the warning signs of  a problem for the current trimester of your pregnancy.  Any of the following apply to you during any trimester of pregnancy: ? You have strong emotions, such as sadness or anxiety, that interfere with work or personal relationships. ? You feel unsafe in your home and need help finding a safe place to live. ? You are using tobacco products, alcohol, or drugs and you need help to stop. Get help right away if: You have signs or symptoms of labor before 37 weeks of pregnancy. These include:  Contractions that are 5 minutes or less apart, or that increase in frequency, intensity, or length.  Sudden, sharp abdominal pain or low back pain.  Uncontrolled gush or trickle of fluid from your vagina. Summary  A pregnancy lasts about 40 weeks, starting from the first day of your last period until the baby is born. Pregnancy is divided into three phases called trimesters. Each trimester has warning signs to watch for.  Always report any warning signs to your health care provider in order to prevent complications that may affect both you and your baby.  Talk with your health care provider about your current health and any medical conditions you have. Make sure you know the symptoms that you should watch for and report. This information is not intended to replace advice given to you by your health care provider. Make sure you discuss any questions you have with your health care provider. Document Revised: 05/05/2018 Document Reviewed: 10/31/2016 Elsevier Patient Education  2020 [employer] Inc.  

## 2019-05-18 NOTE — Progress Notes (Signed)
   PRENATAL INTAKE SUMMARY  Ms. Sweeney presents today New OB Nurse Interview.  OB History    Gravida  1   Para  0   Term  0   Preterm  0   AB  0   Living  0     SAB  0   TAB  0   Ectopic  0   Multiple  0   Live Births             I have reviewed the patient's medical, obstetrical, social, and family histories, medications, and available lab results.  SUBJECTIVE She complains of vaginal discharge and itching.  OBJECTIVE Initial nurse interview for history and labs (New OB).  LATE TO CARE.  EDD: 11/12/19 by LMP: was not sure if she had a period in January or Feb 2021 GA: [redacted]w[redacted]d? G1P0 FHT: 172  GENERAL APPEARANCE: alert, well appearing, in no apparent distress, oriented to person, place and time.  ASSESSMENT Normal pregnancy  PLAN Prenatal care-[facility] Pnl/HIV  OB Urine Culture GC/CT(self-swab) PAP at next visit with Raelyn Mora, CNM 06/17/19 HgbEval/[facility] ([facility]) [facility] A1C Patient to sign up for Mychart and Babyscripts Rx for BP cuff and scale will be sent once Medicaid is approved Continue PNV. Continue PNV Ultrasound detailed +14 wks ordered due to questionable LMP/late to care/confirm dating.  Clovis Pu, RN

## 2019-05-19 LAB — OBSTETRIC PANEL, INCLUDING HIV
Antibody Screen: NEGATIVE
Basophils Absolute: 0.1 10*3/uL (ref 0.0–0.2)
Basos: 1 %
EOS (ABSOLUTE): 0.3 10*3/uL (ref 0.0–0.4)
Eos: 5 %
HIV Screen 4th Generation wRfx: NONREACTIVE
Hematocrit: 37.6 % (ref 34.0–46.6)
Hemoglobin: 12 g/dL (ref 11.1–15.9)
Hepatitis B Surface Ag: NEGATIVE
Immature Grans (Abs): 0 10*3/uL (ref 0.0–0.1)
Immature Granulocytes: 0 %
Lymphocytes Absolute: 1.9 10*3/uL (ref 0.7–3.1)
Lymphs: 34 %
MCH: 25.5 pg — ABNORMAL LOW (ref 26.6–33.0)
MCHC: 31.9 g/dL (ref 31.5–35.7)
MCV: 80 fL (ref 79–97)
Monocytes Absolute: 0.5 10*3/uL (ref 0.1–0.9)
Monocytes: 8 %
Neutrophils Absolute: 3 10*3/uL (ref 1.4–7.0)
Neutrophils: 52 %
Platelets: 267 10*3/uL (ref 150–450)
RBC: 4.7 x10E6/uL (ref 3.77–5.28)
RDW: 12.6 % (ref 11.7–15.4)
RPR Ser Ql: NONREACTIVE
Rh Factor: POSITIVE
Rubella Antibodies, IGG: 3.97 index (ref >0.99)
WBC: 5.7 10*3/uL (ref 3.4–10.8)

## 2019-05-19 LAB — HEMOGLOBIN A1C
Est. average glucose Bld gHb Est-mCnc: 103 mg/dL
Hgb A1c MFr Bld: 5.2 % (ref 4.8–5.6)

## 2019-05-19 LAB — HEPATITIS C ANTIBODY: Hep C Virus Ab: 0.1 s/co ratio (ref 0.0–0.9)

## 2019-05-20 LAB — CERVICOVAGINAL ANCILLARY ONLY
Bacterial Vaginitis (gardnerella): POSITIVE — AB
Candida Glabrata: NEGATIVE
Candida Vaginitis: POSITIVE — AB
Chlamydia: NEGATIVE
Comment: NEGATIVE
Comment: NEGATIVE
Comment: NEGATIVE
Comment: NEGATIVE
Comment: NEGATIVE
Comment: NORMAL
Neisseria Gonorrhea: NEGATIVE
Trichomonas: NEGATIVE

## 2019-05-20 LAB — CULTURE, OB URINE

## 2019-05-20 LAB — URINE CULTURE, OB REFLEX

## 2019-05-21 ENCOUNTER — Encounter: Payer: Self-pay | Admitting: General Practice

## 2019-05-24 ENCOUNTER — Encounter: Payer: Self-pay | Admitting: General Practice

## 2019-05-24 ENCOUNTER — Telehealth: Payer: Self-pay | Admitting: *Deleted

## 2019-05-24 DIAGNOSIS — B379 Candidiasis, unspecified: Secondary | ICD-10-CM

## 2019-05-24 MED ORDER — TERCONAZOLE 0.4 % VA CREA: 1.0000 | TOPICAL_CREAM | Freq: Every day | VAGINAL | 0 refills | Status: DC

## 2019-05-24 NOTE — Telephone Encounter (Signed)
Left voice message for patient to return nurse call regarding test results.  Abbigale Mcelhaney L, RN  

## 2019-05-24 NOTE — Telephone Encounter (Signed)
Patient returned call. Pt verified DOB. Advised of positive yeast and medication sen to pharmacy. Patient stated she is not having any symptoms of yeast infection. Advised patient is she wanted to pick up medication anyway and keep just in case she develop any symptoms.  Clovis Pu, RN

## 2019-05-24 NOTE — Telephone Encounter (Signed)
-----   Message from Raelyn Mora, [place] sent at 05/23/2019 11:19 PM EDT ----- Treat for Yeast only

## 2019-05-31 ENCOUNTER — Encounter: Payer: Self-pay | Admitting: General Practice

## 2019-06-02 ENCOUNTER — Other Ambulatory Visit: Payer: Self-pay | Admitting: *Deleted

## 2019-06-02 DIAGNOSIS — Z3400 Encounter for supervision of normal first pregnancy, unspecified trimester: Secondary | ICD-10-CM

## 2019-06-02 NOTE — Progress Notes (Signed)
Ultrasound ordered from detailed to complete ordering to [facility].  Clovis Pu, RN

## 2019-06-17 ENCOUNTER — Encounter: Payer: Self-pay | Admitting: Obstetrics and Gynecology

## 2019-06-17 ENCOUNTER — Other Ambulatory Visit (HOSPITAL_COMMUNITY)
Admission: RE | Admit: 2019-06-17 | Discharge: 2019-06-17 | Disposition: A | Discharge status: Home / Self Care | Payer: Medicaid Other | Source: Ambulatory Visit | Attending: Obstetrics and Gynecology | Admitting: Obstetrics and Gynecology

## 2019-06-17 ENCOUNTER — Encounter: Payer: Self-pay | Admitting: General Practice

## 2019-06-17 ENCOUNTER — Telehealth: Payer: Self-pay | Admitting: General Practice

## 2019-06-17 ENCOUNTER — Ambulatory Visit (INDEPENDENT_AMBULATORY_CARE_PROVIDER_SITE_OTHER): Payer: Medicaid Other | Admitting: Obstetrics and Gynecology

## 2019-06-17 ENCOUNTER — Other Ambulatory Visit: Payer: Self-pay

## 2019-06-17 VITALS — BP 113/71 | HR 83 | Temp 97.9°F | Wt 131.2 lb

## 2019-06-17 DIAGNOSIS — O26899 Other specified pregnancy related conditions, unspecified trimester: Secondary | ICD-10-CM

## 2019-06-17 DIAGNOSIS — N898 Other specified noninflammatory disorders of vagina: Secondary | ICD-10-CM

## 2019-06-17 DIAGNOSIS — Z124 Encounter for screening for malignant neoplasm of cervix: Secondary | ICD-10-CM | POA: Diagnosis present

## 2019-06-17 DIAGNOSIS — Z3400 Encounter for supervision of normal first pregnancy, unspecified trimester: Secondary | ICD-10-CM | POA: Diagnosis present

## 2019-06-17 DIAGNOSIS — Z3A18 18 weeks gestation of pregnancy: Secondary | ICD-10-CM | POA: Diagnosis not present

## 2019-06-17 DIAGNOSIS — O219 Vomiting of pregnancy, unspecified: Secondary | ICD-10-CM | POA: Diagnosis not present

## 2019-06-17 MED ORDER — VITAFOL GUMMIES 3.33-0.333-34.8 MG PO CHEW: 3.0000 | CHEWABLE_TABLET | Freq: Every day | ORAL | 12 refills | Status: DC

## 2019-06-17 MED ORDER — BLOOD PRESSURE MONITOR AUTOMAT DEVI: 1.0000 | Freq: Every day | 0 refills | Status: DC

## 2019-06-17 MED ORDER — GOJJI WEIGHT SCALE MISC: 1.0000 | Freq: Every day | 0 refills | Status: DC | PRN

## 2019-06-17 MED ORDER — ONDANSETRON 4 MG PO TBDP: 4.0000 mg | ORAL_TABLET | Freq: Three times a day (TID) | ORAL | 0 refills | Status: DC | PRN

## 2019-06-17 NOTE — Progress Notes (Signed)
INITIAL OBSTETRICAL VISIT Patient name: Karen Rojas MRN [id number]  Date of birth: 1995/12/16 Chief Complaint:   Initial Prenatal Visit  History of Present Illness:   Karen Rojas is a 24 y.o. G35P0000 African American female at [redacted]w[redacted]d by LMP with an Estimated Date of Delivery: 11/12/19 being seen today for her initial obstetrical visit.  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 This is an  unplanned pregnancy. She and the father of the baby (FOB) "Malachi"  live together. She has a support system that consists of FOB/her mother/father/friends. Today she reports nausea and white vaginal discharge that is not irritating.   Patient's last menstrual period was 02/05/2019 (within months). Last pap unknown. Results were: unknown Review of Systems:   Pertinent items are noted in HPI Denies cramping/contractions, leakage of fluid, vaginal bleeding, abnormal vaginal discharge w/ itching/odor/irritation, headaches, visual changes, shortness of breath, chest pain, abdominal pain, severe nausea/vomiting, or problems with urination or bowel movements unless otherwise stated above.  Pertinent History Reviewed:  Reviewed past medical,surgical, social, obstetrical and family history.  Reviewed problem list, medications and allergies. OB History  Gravida Para Term Preterm AB Living  1 0 0 0 0 0  SAB TAB Ectopic Multiple Live Births  0 0 0 0      # Outcome Date GA Lbr Len/2nd Weight Sex Delivery Anes PTL Lv  1 Current            Physical Assessment:   Vitals:   06/17/19 1537  BP: 113/71  Pulse: 83  Temp: 97.9 F (36.6 C)  Weight: 131 lb 3.2 oz (59.5 kg)  Body mass index is 24.79 kg/m.       Physical Examination:  General appearance - well appearing, and in no distress  Mental status - alert, oriented to person, place, and time  Psych:  She has a normal mood and affect  Skin - warm and dry, normal color, no suspicious lesions  noted  Chest - effort normal, all lung fields clear to auscultation bilaterally  Heart - normal rate and regular rhythm  Abdomen - soft, nontender  Extremities:  No swelling or varicosities noted  Pelvic - VULVA: normal appearing vulva with no masses, tenderness or lesions  VAGINA: normal appearing vagina with normal color and discharge, no lesions.   CERVIX: normal appearing cervix without discharge or lesions, no CMT  Thin prep pap is done with reflex HR HPV cotesting   No results found for this or any previous visit (from the past 24 hour(s)).  Assessment & Plan:  1) Low-Risk Pregnancy G1P0000 at [redacted]w[redacted]d with an Estimated Date of Delivery: 11/12/19   2) Initial OB visit - Welcomed to practice and introduced self to patient in addition to discussing other advanced practice providers that she may be seeing at this practice - Congratulated patient - Anticipatory guidance on upcoming appointments - Educated on COVID19 and pregnancy and the integration of virtual appointments  - Educated on babyscripts app- patient reports she has not received email, encouraged to look in spam folder and to call office if she still has not received email - patient verbalizes understanding   3) Supervision of normal first pregnancy, antepartum  - Rx for Prenatal Vit-Fe Phos-FA-Omega (VITAFOL GUMMIES) 3.33-0.333-34.8 MG CHEW, - - Cervicovaginal ancillary only( [place]),  - Cytology - PAP( [place]),  - AFP TETRA,  - Rx for Blood Pressure Monitoring (BLOOD PRESSURE MONITOR AUTOMAT) [place],  - Rx for Misc. Devices (  GOJJI WEIGHT SCALE) MISC  4) Vaginal discharge during pregnancy, antepartum  - Cervicovaginal ancillary only( [place])  5) Nausea and vomiting of pregnancy, antepartum  - Rx for ondansetron (ZOFRAN ODT) 4 MG disintegrating tablet    Meds:  Meds ordered this encounter  Medications  . Prenatal Vit-Fe Phos-FA-Omega (VITAFOL GUMMIES) 3.33-0.333-34.8 MG CHEW    Sig: Chew 3 each by mouth  daily.    Dispense:  90 tablet    Refill:  12  . Blood Pressure Monitoring (BLOOD PRESSURE MONITOR AUTOMAT) DEVI    Sig: 1 Device by Does not apply route daily. Automatic blood pressure cuff regular size. To monitor blood pressure regularly at home. ICD-10 code:Z34.90    Dispense:  1 each    Refill:  0  . Misc. Devices (GOJJI WEIGHT SCALE) MISC    Sig: 1 Device by Does not apply route daily as needed. To weight self daily as needed at home. ICD-10 code: Z34.90    Dispense:  1 each    Refill:  0  . ondansetron (ZOFRAN ODT) 4 MG disintegrating tablet    Sig: Take 1 tablet (4 mg total) by mouth every 8 (eight) hours as needed for nausea or vomiting.    Dispense:  20 tablet    Refill:  0    Initial labs obtained Continue prenatal vitamins Reviewed n/v relief measures and warning s/s to report Reviewed recommended weight gain based on pre-gravid BMI Encouraged well-balanced diet Genetic Screening discussed: results reviewed Cystic fibrosis, SMA, Fragile X screening discussed results reviewed The nature of [facility] with multiple MDs and other Advanced Practice Providers was explained to patient; also emphasized that residents, students are part of our team.  Discussed optimized OB schedule and video visits. Advised can have an in-office visit whenever she feels she needs to be seen.  Does not have own BP cuff. BP cuff Rx faxed today. Explained to patient that BP will be mailed to her house. Check BP weekly, let us know if >140/90. Advised to call during normal business hours and there is an after-hours nurse line available.   Follow-up: Return in about 4 weeks (around 07/15/2019) for Return OB - My Chart video.   Orders Placed This Encounter  Procedures  . AFP TETRA    Laury Deep MSN, [place] 06/17/2019

## 2019-06-17 NOTE — Patient Instructions (Addendum)
Karen Rojas genetic counselor appointment number: [phone].    Second Trimester of Pregnancy The second trimester is from week 14 through week 27 (months 4 through 6). The second trimester is often a time when you feel your best. Your body has adjusted to being pregnant, and you begin to feel better physically. Usually, morning sickness has lessened or quit completely, you may have more energy, and you may have an increase in appetite. The second trimester is also a time when the fetus is growing rapidly. At the end of the sixth month, the fetus is about 9 inches long and weighs about 1 pounds. You will likely begin to feel the baby move (quickening) between 16 and 20 weeks of pregnancy. Body changes during your second trimester Your body continues to go through many changes during your second trimester. The changes vary from woman to woman.  Your weight will continue to increase. You will notice your lower abdomen bulging out.  You may begin to get stretch marks on your hips, abdomen, and breasts.  You may develop headaches that can be relieved by medicines. The medicines should be approved by your health care provider.  You may urinate more often because the fetus is pressing on your bladder.  You may develop or continue to have heartburn as a result of your pregnancy.  You may develop constipation because certain hormones are causing the muscles that push waste through your intestines to slow down.  You may develop hemorrhoids or swollen, bulging veins (varicose veins).  You may have back pain. This is caused by: ? Weight gain. ? Pregnancy hormones that are relaxing the joints in your pelvis. ? A shift in weight and the muscles that support your balance.  Your breasts will continue to grow and they will continue to become tender.  Your gums may bleed and may be sensitive to brushing and flossing.  Dark spots or blotches (chloasma, mask of pregnancy) may develop on your face.  This will likely fade after the baby is born.  A dark line from your belly button to the pubic area (linea nigra) may appear. This will likely fade after the baby is born.  You may have changes in your hair. These can include thickening of your hair, rapid growth, and changes in texture. Some women also have hair loss during or after pregnancy, or hair that feels dry or thin. Your hair will most likely return to normal after your baby is born. What to expect at prenatal visits During a routine prenatal visit:  You will be weighed to make sure you and the fetus are growing normally.  Your blood pressure will be taken.  Your abdomen will be measured to track your baby's growth.  The fetal heartbeat will be listened to.  Any test results from the previous visit will be discussed. Your health care provider may ask you:  How you are feeling.  If you are feeling the baby move.  If you have had any abnormal symptoms, such as leaking fluid, bleeding, severe headaches, or abdominal cramping.  If you are using any tobacco products, including cigarettes, chewing tobacco, and electronic cigarettes.  If you have any questions. Other tests that may be performed during your second trimester include:  Blood tests that check for: ? Low iron levels (anemia). ? High blood sugar that affects pregnant women (gestational diabetes) between 44 and 28 weeks. ? Rh antibodies. This is to check for a protein on red blood cells (Rh factor).  Urine  tests to check for infections, diabetes, or protein in the urine.  An ultrasound to confirm the proper growth and development of the baby.  An amniocentesis to check for possible genetic problems.  Fetal screens for spina bifida and Down syndrome.  HIV (human immunodeficiency virus) testing. Routine prenatal testing includes screening for HIV, unless you choose not to have this test. Follow these instructions at home: Medicines  Follow your health care  provider's instructions regarding medicine use. Specific medicines may be either safe or unsafe to take during pregnancy.  Take a prenatal vitamin that contains at least 600 micrograms (mcg) of folic acid.  If you develop constipation, try taking a stool softener if your health care provider approves. Eating and drinking   Eat a balanced diet that includes fresh fruits and vegetables, whole grains, good sources of protein such as meat, eggs, or tofu, and low-fat dairy. Your health care provider will help you determine the amount of weight gain that is right for you.  Avoid raw meat and uncooked cheese. These carry germs that can cause birth defects in the baby.  If you have low calcium intake from food, talk to your health care provider about whether you should take a daily calcium supplement.  Limit foods that are high in fat and processed sugars, such as fried and sweet foods.  To prevent constipation: ? Drink enough fluid to keep your urine clear or pale yellow. ? Eat foods that are high in fiber, such as fresh fruits and vegetables, whole grains, and beans. Activity  Exercise only as directed by your health care provider. Most women can continue their usual exercise routine during pregnancy. Try to exercise for 30 minutes at least 5 days a week. Stop exercising if you experience uterine contractions.  Avoid heavy lifting, wear low heel shoes, and practice good posture.  A sexual relationship may be continued unless your health care provider directs you otherwise. Relieving pain and discomfort  Wear a good support bra to prevent discomfort from breast tenderness.  Take warm sitz baths to soothe any pain or discomfort caused by hemorrhoids. Use hemorrhoid cream if your health care provider approves.  Rest with your legs elevated if you have leg cramps or low back pain.  If you develop varicose veins, wear support hose. Elevate your feet for 15 minutes, 3-4 times a day. Limit salt  in your diet. Prenatal Care  Write down your questions. Take them to your prenatal visits.  Keep all your prenatal visits as told by your health care provider. This is important. Safety  Wear your seat belt at all times when driving.  Make a list of emergency phone numbers, including numbers for family, friends, the hospital, and police and fire departments. General instructions  Ask your health care provider for a referral to a local prenatal education class. Begin classes no later than the beginning of month 6 of your pregnancy.  Ask for help if you have counseling or nutritional needs during pregnancy. Your health care provider can offer advice or refer you to specialists for help with various needs.  Do not use hot tubs, steam rooms, or saunas.  Do not douche or use tampons or scented sanitary pads.  Do not cross your legs for long periods of time.  Avoid cat litter boxes and soil used by cats. These carry germs that can cause birth defects in the baby and possibly loss of the fetus by miscarriage or stillbirth.  Avoid all smoking, herbs, alcohol, and  unprescribed drugs. Chemicals in these products can affect the formation and growth of the baby.  Do not use any products that contain nicotine or tobacco, such as cigarettes and e-cigarettes. If you need help quitting, ask your health care provider.  Visit your dentist if you have not gone yet during your pregnancy. Use a soft toothbrush to brush your teeth and be gentle when you floss. Contact a health care provider if:  You have dizziness.  You have mild pelvic cramps, pelvic pressure, or nagging pain in the abdominal area.  You have persistent nausea, vomiting, or diarrhea.  You have a bad smelling vaginal discharge.  You have pain when you urinate. Get help right away if:  You have a fever.  You are leaking fluid from your vagina.  You have spotting or bleeding from your vagina.  You have severe abdominal  cramping or pain.  You have rapid weight gain or weight loss.  You have shortness of breath with chest pain.  You notice sudden or extreme swelling of your face, hands, ankles, feet, or legs.  You have not felt your baby move in over an hour.  You have severe headaches that do not go away when you take medicine.  You have vision changes. Summary  The second trimester is from week 14 through week 27 (months 4 through 6). It is also a time when the fetus is growing rapidly.  Your body goes through many changes during pregnancy. The changes vary from woman to woman.  Avoid all smoking, herbs, alcohol, and unprescribed drugs. These chemicals affect the formation and growth your baby.  Do not use any tobacco products, such as cigarettes, chewing tobacco, and e-cigarettes. If you need help quitting, ask your health care provider.  Contact your health care provider if you have any questions. Keep all prenatal visits as told by your health care provider. This is important. This information is not intended to replace advice given to you by your health care provider. Make sure you discuss any questions you have with your health care provider. Document Revised: 05/08/2018 Document Reviewed: 02/20/2016 Elsevier Patient Education  2020 [employer].

## 2019-06-17 NOTE — Telephone Encounter (Signed)
Patient had high score on PHQ-9 screener.  [facility] services offered to patient, but pt declined.

## 2019-06-18 ENCOUNTER — Ambulatory Visit: Payer: Medicaid Other | Attending: Obstetrics and Gynecology

## 2019-06-18 ENCOUNTER — Ambulatory Visit: Payer: Medicaid Other | Admitting: *Deleted

## 2019-06-18 ENCOUNTER — Other Ambulatory Visit: Payer: Self-pay | Admitting: Obstetrics and Gynecology

## 2019-06-18 DIAGNOSIS — Z3400 Encounter for supervision of normal first pregnancy, unspecified trimester: Secondary | ICD-10-CM

## 2019-06-18 DIAGNOSIS — Z363 Encounter for antenatal screening for malformations: Secondary | ICD-10-CM | POA: Diagnosis not present

## 2019-06-18 DIAGNOSIS — Z3A13 13 weeks gestation of pregnancy: Secondary | ICD-10-CM | POA: Diagnosis not present

## 2019-06-19 LAB — AFP TETRA
DIA Mom Value: 1.45
DIA Value (EIA): 247.92 pg/mL
DSR (By Age)    1 IN: 1060
DSR (Second Trimester) 1 IN: 125
Gestational Age: 18 WEEKS
MSAFP Mom: 0.77
MSAFP: 41.3 ng/mL
MSHCG Mom: 2.03
MSHCG: 62863 m[IU]/mL
Maternal Age At EDD: 24.3 yr
Osb Risk: 10000
T18 (By Age): 1:4128 {titer}
Test Results:: POSITIVE — AB
Weight: 131 [lb_av]
uE3 Mom: 0.2
uE3 Value: 0.3 ng/mL

## 2019-06-21 ENCOUNTER — Other Ambulatory Visit: Payer: Self-pay | Admitting: *Deleted

## 2019-06-21 DIAGNOSIS — Z3492 Encounter for supervision of normal pregnancy, unspecified, second trimester: Secondary | ICD-10-CM

## 2019-06-21 LAB — CERVICOVAGINAL ANCILLARY ONLY
Bacterial Vaginitis (gardnerella): POSITIVE — AB
Candida Glabrata: NEGATIVE
Candida Vaginitis: POSITIVE — AB
Chlamydia: NEGATIVE
Comment: NEGATIVE
Comment: NEGATIVE
Comment: NEGATIVE
Comment: NEGATIVE
Comment: NEGATIVE
Comment: NORMAL
Neisseria Gonorrhea: NEGATIVE
Trichomonas: NEGATIVE

## 2019-06-21 LAB — CYTOLOGY - PAP: Diagnosis: NEGATIVE

## 2019-06-23 ENCOUNTER — Telehealth: Payer: Self-pay | Admitting: *Deleted

## 2019-06-23 DIAGNOSIS — Z3400 Encounter for supervision of normal first pregnancy, unspecified trimester: Secondary | ICD-10-CM

## 2019-06-23 DIAGNOSIS — O23599 Infection of other part of genital tract in pregnancy, unspecified trimester: Secondary | ICD-10-CM

## 2019-06-23 DIAGNOSIS — B379 Candidiasis, unspecified: Secondary | ICD-10-CM

## 2019-06-23 MED ORDER — METRONIDAZOLE 500 MG PO TABS: 500.0000 mg | ORAL_TABLET | Freq: Two times a day (BID) | ORAL | 0 refills | Status: DC

## 2019-06-23 MED ORDER — TERCONAZOLE 0.4 % VA CREA: 1.0000 | TOPICAL_CREAM | Freq: Every day | VAGINAL | 0 refills | Status: DC

## 2019-06-23 NOTE — Telephone Encounter (Signed)
-----   Message from Raelyn Mora, [place] sent at 06/22/2019 12:18 PM EDT ----- Please treat for BV and yeast

## 2019-07-15 ENCOUNTER — Telehealth (INDEPENDENT_AMBULATORY_CARE_PROVIDER_SITE_OTHER): Payer: Medicaid Other | Admitting: Obstetrics and Gynecology

## 2019-07-15 ENCOUNTER — Encounter: Payer: Self-pay | Admitting: Obstetrics and Gynecology

## 2019-07-15 DIAGNOSIS — Z3402 Encounter for supervision of normal first pregnancy, second trimester: Secondary | ICD-10-CM

## 2019-07-15 DIAGNOSIS — Z3A17 17 weeks gestation of pregnancy: Secondary | ICD-10-CM

## 2019-07-15 DIAGNOSIS — Z3400 Encounter for supervision of normal first pregnancy, unspecified trimester: Secondary | ICD-10-CM

## 2019-07-15 NOTE — Progress Notes (Signed)
   MY CHART VIDEO VIRTUAL OBSTETRICS VISIT ENCOUNTER NOTE  I connected with [name] on 07/15/19 at  4:10 PM EDT by My Chart video at home and verified that I am speaking with the correct person using two identifiers. Provider located at [facility] for [employer] at [place].   I discussed the limitations, risks, security and privacy concerns of performing an evaluation and management service by My Chart video and the availability of in person appointments. I also discussed with the patient that there may be a patient responsible charge related to this service. The patient expressed understanding and agreed to proceed.  Subjective:  Karen Rojas is a 24 y.o. G1P0000 at [redacted]w[redacted]d being followed for ongoing prenatal care.  She is currently monitored for the following issues for this low-risk pregnancy and has Chlamydia; IUD check up; and Supervision of normal first pregnancy, antepartum on their problem list.  Patient reports some pelvic pressure. Reports fetal movement. Denies any contractions, bleeding or leaking of fluid.   The following portions of the patient's history were reviewed and updated as appropriate: allergies, current medications, past family history, past medical history, past social history, past surgical history and problem list.   Objective:   General:  Alert, oriented and cooperative.   Mental Status: Normal mood and affect perceived. Normal judgment and thought content.  Rest of physical exam deferred due to type of encounter  BP 112/62   Pulse 74   Wt 130 lb 11.2 oz (59.3 kg)   LMP 02/05/2019 (Within Months)   BMI 24.70 kg/m  **Done by patient's own at home BP cuff and scale  Assessment and Plan:  Pregnancy: G1P0000 at [redacted]w[redacted]d  1. Supervision of normal first pregnancy, antepartum - Reminded of U/S appt on 07/30/2019 - Anticipatory guidance for next visit in 4 wks  Return next week for AFP lab redraw  Preterm labor symptoms and general obstetric precautions  including but not limited to vaginal bleeding, contractions, leaking of fluid and fetal movement were reviewed in detail with the patient.  I discussed the assessment and treatment plan with the patient. The patient was provided an opportunity to ask questions and all were answered. The patient agreed with the plan and demonstrated an understanding of the instructions. The patient was advised to call back or seek an in-person office evaluation/go to MAU at [facility] for any urgent or concerning symptoms. Please refer to After Visit Summary for other counseling recommendations.   I provided 5 minutes of non-face-to-face time during this encounter. There was 5 minutes of chart review time spent prior to this encounter. Total time spent = 10 minutes.  Return in about 1 week (around 07/22/2019) for Lab (AFP) appt and 4 wks ROB by My Chart.  Future Appointments  Date Time Provider Department Center  07/30/2019  2:00 PM [facility] NURSE [facility]  07/30/2019  2:00 PM [facility] US1 [facility]    Raelyn Mora, CNM Center for [employer], [facility]

## 2019-07-19 ENCOUNTER — Encounter: Payer: Self-pay | Admitting: Obstetrics and Gynecology

## 2019-07-26 ENCOUNTER — Telehealth: Payer: Self-pay | Admitting: General Practice

## 2019-07-26 NOTE — Telephone Encounter (Signed)
Left message for patient to give our office a call to schedule labwork for AFP labs this week.

## 2019-07-28 ENCOUNTER — Other Ambulatory Visit (INDEPENDENT_AMBULATORY_CARE_PROVIDER_SITE_OTHER): Payer: Medicaid Other | Admitting: *Deleted

## 2019-07-28 ENCOUNTER — Other Ambulatory Visit: Payer: Self-pay

## 2019-07-28 VITALS — Wt 131.6 lb

## 2019-07-28 DIAGNOSIS — Z3400 Encounter for supervision of normal first pregnancy, unspecified trimester: Secondary | ICD-10-CM

## 2019-07-28 DIAGNOSIS — Z3402 Encounter for supervision of normal first pregnancy, second trimester: Secondary | ICD-10-CM

## 2019-07-28 DIAGNOSIS — Z3A19 19 weeks gestation of pregnancy: Secondary | ICD-10-CM

## 2019-07-28 NOTE — Progress Notes (Signed)
   Patient in clinic for AFP lab repeat.  Clovis Pu, RN

## 2019-07-30 ENCOUNTER — Other Ambulatory Visit: Payer: Self-pay

## 2019-07-30 ENCOUNTER — Ambulatory Visit: Payer: Medicaid Other | Attending: Obstetrics and Gynecology

## 2019-07-30 ENCOUNTER — Ambulatory Visit: Payer: Medicaid Other | Admitting: *Deleted

## 2019-07-30 ENCOUNTER — Other Ambulatory Visit: Payer: Self-pay | Admitting: *Deleted

## 2019-07-30 DIAGNOSIS — Z148 Genetic carrier of other disease: Secondary | ICD-10-CM

## 2019-07-30 DIAGNOSIS — Z3A19 19 weeks gestation of pregnancy: Secondary | ICD-10-CM

## 2019-07-30 DIAGNOSIS — Z3400 Encounter for supervision of normal first pregnancy, unspecified trimester: Secondary | ICD-10-CM | POA: Insufficient documentation

## 2019-07-30 DIAGNOSIS — Z3492 Encounter for supervision of normal pregnancy, unspecified, second trimester: Secondary | ICD-10-CM | POA: Insufficient documentation

## 2019-07-30 DIAGNOSIS — Z363 Encounter for antenatal screening for malformations: Secondary | ICD-10-CM

## 2019-07-30 DIAGNOSIS — Z362 Encounter for other antenatal screening follow-up: Secondary | ICD-10-CM

## 2019-07-30 LAB — AFP, SERUM, OPEN SPINA BIFIDA
AFP MoM: 1.26
AFP Value: 77.4 ng/mL
Gest. Age on Collection Date: 19 weeks
Maternal Age At EDD: 24.4 yr
OSBR Risk 1 IN: 10000
Test Results:: NEGATIVE
Weight: 131 [lb_av]

## 2019-08-11 ENCOUNTER — Telehealth (INDEPENDENT_AMBULATORY_CARE_PROVIDER_SITE_OTHER): Payer: Medicaid Other | Admitting: Medical

## 2019-08-11 ENCOUNTER — Encounter: Payer: Self-pay | Admitting: Medical

## 2019-08-11 DIAGNOSIS — Z3A21 21 weeks gestation of pregnancy: Secondary | ICD-10-CM

## 2019-08-11 DIAGNOSIS — Z1371 Encounter for nonprocreative screening for genetic disease carrier status: Secondary | ICD-10-CM

## 2019-08-11 DIAGNOSIS — Z3400 Encounter for supervision of normal first pregnancy, unspecified trimester: Secondary | ICD-10-CM

## 2019-08-11 DIAGNOSIS — Z3402 Encounter for supervision of normal first pregnancy, second trimester: Secondary | ICD-10-CM

## 2019-08-11 NOTE — Progress Notes (Signed)
   OBSTETRICS PRENATAL VIRTUAL VISIT ENCOUNTER NOTE  Provider location: Center for [facility]   I connected with Karen Rojas on 08/11/19 at  4:10 PM EDT by MyChart Video Encounter at home and verified that I am speaking with the correct person using two identifiers.   I discussed the limitations, risks, security and privacy concerns of performing an evaluation and management service virtually and the availability of in person appointments. I also discussed with the patient that there may be a patient responsible charge related to this service. The patient expressed understanding and agreed to proceed. Subjective:  Karen Rojas is a 24 y.o. G1P0000 at [redacted]w[redacted]d being seen today for ongoing prenatal care.  She is currently monitored for the following issues for this low-risk pregnancy and has Chlamydia; Supervision of normal first pregnancy, antepartum; and Screening for genetic disease carrier status on their problem list.  Patient reports occasional cramping.  Contractions: Not present. Vag. Bleeding: None.  Movement: Present. Denies any leaking of fluid.   The following portions of the patient's history were reviewed and updated as appropriate: allergies, current medications, past family history, past medical history, past social history, past surgical history and problem list.   Objective:   Vitals:   08/11/19 1537  BP: 119/68  Pulse: 84  Weight: 133 lb (60.3 kg)    Fetal Status:     Movement: Present     General:  Alert, oriented and cooperative. Patient is in no acute distress.  Respiratory: Normal respiratory effort, no problems with respiration noted  Mental Status: Normal mood and affect. Normal behavior. Normal judgment and thought content.  Rest of physical exam deferred due to type of encounter   Assessment and Plan:  Pregnancy: G1P0000 at [redacted]w[redacted]d 1. Screening for genetic disease carrier status - Discussed + carrier screening, patient declines partner  testing or genetic counseling at this time   2. Supervision of normal first pregnancy, antepartum - Doing well - Follow-up US for growth scheduled 7/30 since last [place] changed EDC - Patient mentioned having small blood from nipples once previously, not happening now, advised to call for appointment if occurs again so a sample can be collected. Do not stimulate breast or nipple.   Preterm labor symptoms and general obstetric precautions including but not limited to vaginal bleeding, contractions, leaking of fluid and fetal movement were reviewed in detail with the patient. I discussed the assessment and treatment plan with the patient. The patient was provided an opportunity to ask questions and all were answered. The patient agreed with the plan and demonstrated an understanding of the instructions. The patient was advised to call back or seek an in-person office evaluation/go to MAU at [facility] for any urgent or concerning symptoms. Please refer to After Visit Summary for other counseling recommendations.   I provided 10 minutes of non face-to-face time during this encounter.  Return in about 4 weeks (around 09/08/2019) for LOB, In-Person.  Future Appointments  Date Time Provider Department Center  08/11/2019  4:10 PM Kathlene Cote [facility] None  08/27/2019  1:00 PM [facility] NURSE [facility]  08/27/2019  1:15 PM [facility] US2 [facility]    Vonzella Nipple, PA-C Center for [employer], [facility]

## 2019-08-11 NOTE — Patient Instructions (Addendum)
Second Trimester of Pregnancy  The second trimester is from week 14 through week 27 (month 4 through 6). This is often the time in pregnancy that you feel your best. Often times, morning sickness has lessened or quit. You may have more energy, and you may get hungry more often. Your unborn baby is growing rapidly. At the end of the sixth month, he or she is about 9 inches long and weighs about 1 pounds. You will likely feel the baby move between 18 and 20 weeks of pregnancy. Follow these instructions at home: Medicines  Take over-the-counter and prescription medicines only as told by your doctor. Some medicines are safe and some medicines are not safe during pregnancy.  Take a prenatal vitamin that contains at least 600 micrograms (mcg) of folic acid.  If you have trouble pooping (constipation), take medicine that will make your stool soft (stool softener) if your doctor approves. Eating and drinking   Eat regular, healthy meals.  Avoid raw meat and uncooked cheese.  If you get low calcium from the food you eat, talk to your doctor about taking a daily calcium supplement.  Avoid foods that are high in fat and sugars, such as fried and sweet foods.  If you feel sick to your stomach (nauseous) or throw up (vomit): ? Eat 4 or 5 small meals a day instead of 3 large meals. ? Try eating a few soda crackers. ? Drink liquids between meals instead of during meals.  To prevent constipation: ? Eat foods that are high in fiber, like fresh fruits and vegetables, whole grains, and beans. ? Drink enough fluids to keep your pee (urine) clear or pale yellow. Activity  Exercise only as told by your doctor. Stop exercising if you start to have cramps.  Do not exercise if it is too hot, too humid, or if you are in a place of great height (high altitude).  Avoid heavy lifting.  Wear low-heeled shoes. Sit and stand up straight.  You can continue to have sex unless your doctor tells you not  to. Relieving pain and discomfort  Wear a good support bra if your breasts are tender.  Take warm water baths (sitz baths) to soothe pain or discomfort caused by hemorrhoids. Use hemorrhoid cream if your doctor approves.  Rest with your legs raised if you have leg cramps or low back pain.  If you develop puffy, bulging veins (varicose veins) in your legs: ? Wear support hose or compression stockings as told by your doctor. ? Raise (elevate) your feet for 15 minutes, 3-4 times a day. ? Limit salt in your food. Prenatal care  Write down your questions. Take them to your prenatal visits.  Keep all your prenatal visits as told by your doctor. This is important. Safety  Wear your seat belt when driving.  Make a list of emergency phone numbers, including numbers for family, friends, the hospital, and police and fire departments. General instructions  Ask your doctor about the right foods to eat or for help finding a counselor, if you need these services.  Ask your doctor about local prenatal classes. Begin classes before month 6 of your pregnancy.  Do not use hot tubs, steam rooms, or saunas.  Do not douche or use tampons or scented sanitary pads.  Do not cross your legs for long periods of time.  Visit your dentist if you have not done so. Use a soft toothbrush to brush your teeth. Floss gently.  Avoid all smoking, herbs,   and alcohol. Avoid drugs that are not approved by your doctor.  Do not use any products that contain nicotine or tobacco, such as cigarettes and e-cigarettes. If you need help quitting, ask your doctor.  Avoid cat litter boxes and soil used by cats. These carry germs that can cause birth defects in the baby and can cause a loss of your baby (miscarriage) or stillbirth. Contact a doctor if:  You have mild cramps or pressure in your lower belly.  You have pain when you pee (urinate).  You have bad smelling fluid coming from your vagina.  You continue to  feel sick to your stomach (nauseous), throw up (vomit), or have watery poop (diarrhea).  You have a nagging pain in your belly area.  You feel dizzy. Get help right away if:  You have a fever.  You are leaking fluid from your vagina.  You have spotting or bleeding from your vagina.  You have severe belly cramping or pain.  You lose or gain weight rapidly.  You have trouble catching your breath and have chest pain.  You notice sudden or extreme puffiness (swelling) of your face, hands, ankles, feet, or legs.  You have not felt the baby move in over an hour.  You have severe headaches that do not go away when you take medicine.  You have trouble seeing. Summary  The second trimester is from week 14 through week 27 (months 4 through 6). This is often the time in pregnancy that you feel your best.  To take care of yourself and your unborn baby, you will need to eat healthy meals, take medicines only if your doctor tells you to do so, and do activities that are safe for you and your baby.  Call your doctor if you get sick or if you notice anything unusual about your pregnancy. Also, call your doctor if you need help with the right food to eat, or if you want to know what activities are safe for you. This information is not intended to replace advice given to you by your health care provider. Make sure you discuss any questions you have with your health care provider. Document Revised: 05/08/2018 Document Reviewed: 02/20/2016 Elsevier Patient Education  2020 Elsevier Inc.  AREA PEDIATRIC/FAMILY PRACTICE PHYSICIANS  [place] ([address]) . [facility] o Chambliss, MD; Eniola, MD; Hale, MD; Hensel, MD; McDiarmid, MD; McIntyer, MD; Neal, MD; Walden, MD o [address] [place], [place] [address] o [phone] o Mon-Fri 8:30-12:30, 1:30-5:00 o Providers come to see babies at [facility] o Accepting Medicaid . [facility] o Limited providers who accept newborns: Koirala, MD; Morrow, MD; Wolters, MD o [address], [place], [place] [address] o [phone] o Mon-Fri 8:00-5:30 o Babies seen by providers at [facility] o Does NOT accept Medicaid o Please call early in hospitalization for appointment (limited availability)  . [facility] o Mulberry, MD o [address] [place], [place] [address] o [phone] o Mon, Tue, Thur, Fri 8:30-5:00, Wed 10:00-7:00 (closed 1-2pm) o Babies seen by [facility] providers o Accepting Medicaid . [facility] o Rubin, MD o [address], [place], [place] [address] o [phone] o Mon-Fri 8:30-5:00, Sat 8:30-12:00 o Provider comes to see babies at [facility] o Accepting Medicaid o Must have been referred from current patients or contacted office prior to delivery . [facility] (  [facility]) o Brown, MD; Chandler, MD; Ettefagh, MD; Grant, MD; Lester, MD; McCormick, MD; McQueen, MD; Prose, MD; Simha, MD; Stanley, MD; Stryffeler, NP; Tebben, NP o [address], [place], [place] [address] o [phone] o Mon, Tue, Thur, Fri 8:30-5:30, Wed 9:30-5:30, Sat 8:30-12:30 o Babies seen by [facility] providers o Accepting Medicaid o Only accepting infants of first-time parents or siblings of current patients o Hospital discharge coordinator will make follow-up appointment . Jack Amos o [address], [place], [place]  [address] o [phone]   Fax - [phone] . [facility] o [address], [place], [place]  [address] o Phone - [phone]   Fax - [phone] . Shilpa Gosrani o [address], [place], [place]  [address] o [phone]  [place] ([address]) . [facility] o Bates, MD; [place], MD; Cooper, Cox, MD; MD; Davis, MD; Dovico, MD;  Ettefaugh, MD; Little, MD; Lowe, MD; Keiffer, MD; Melvin, MD; Sumner, MD; Williams, MD o [address], [place], [place] [address] o [phone] o Mon-Fri 8:30-5:00 (extended evenings Mon-Thur as needed), Sat-Sun 10:00-1:00 o Providers come to see babies at [facility] o Accepting Medicaid for families of first-time babies and families with all children in the household age 3 and under. Must register with office prior to making appointment (M-F only). . [facility] o Henson, NP; Knapp, MD; Lalonde, MD; Tysinger, PA o [address] [place], [place] [address] o [phone] o Mon-Fri 8:00-5:00 o Babies seen by providers at [facility] o Does NOT accept Medicaid/Commercial Insurance Only . [facility] ([facility])  o Artis, MD; Barnes, MD; Bratton, MD; Coccaro, MD; [place] Gardner, MD; Kramer, MD; Marshall, MD; Netherton, MD; Poleto, MD; Skinner, MD o [address] [place], [place] [address] o [phone] o Mon-Fri 8:30-5:30, Sat (Oct.-Mar.) 9:00-1:00 o Babies seen by providers at [facility] o Accepting Medicaid  [place] ([address]) . [facility] o Reid, MD; Warner, MD o [address], [place], [place] [address] o [phone] o Mon-Fri 8:30-5:00, Sat 8:30-12:00 o Providers come to see babies at [facility] o Does NOT accept Medicaid . [facility] o Becker, PA; Hagler, MD; Scifres, PA; Sun, MD; Swayne, MD o [address], [place], [place] [address] o [phone] o Mon-Fri 8:00-5:00 o Babies seen by providers at [facility] o Does NOT accept Medicaid o Only accepting babies of parents who are patients o Please call early in hospitalization for appointment (limited availability) . [facility] o Clark, MD; Frye, MD; Kelleher, MD; Mack, NP; Miller, MD; O'Keller, MD; Patterson, NP; Pudlo, MD; Puzio, MD; Thomas, MD;  Tucker, MD; Twiselton, MD o [address], [place], [place] [address] o [phone] o Mon-Fri 8:00-5:00, Sat 9:00-12:00 o Providers come to see babies at [facility] o Does NOT accept Medicaid  [place] ([address]) . [facility] o Limited providers accepting new patients: Brake, NP; Wharton, PA o [address], [place], [place] [address] o [phone] o Mon-Fri 8:00-5:00 o Babies seen by providers at [facility] o Does NOT accept Medicaid o Only accepting babies of parents who are patients o Please call early in hospitalization for appointment (limited availability) . [facility] o Gay, MD; Quinlan, MD o [address] [place], [place] [address] o [phone] (press 1 to schedule appointment) o Mon-Fri 8:00-5:00 o Providers come to   see babies at [facility] o Does NOT accept Medicaid . [facility] o Mazer, MD o [address] [place], [place] [address] o [phone] o Mon-Fri 8:30-5:00 (lunch 12:30-1:00), extended hours by appointment only Wed 5:00-6:30 o Babies seen by [facility] providers o Accepting Medicaid . [facility] o Banks, MD; Jordan, MD; Koberlein, MD o [address], [place], [place] [address] o [phone] o Mon-Fri 8:00-5:00 o Babies seen by [facility] providers o Does NOT accept Medicaid . [facility] o Parker, MD; Hunter, MD; Wallace, DO o [address] [place], [place] [address] o [phone] o Mon-Fri 8:00-5:00 o Babies seen by [facility] providers o Does NOT accept Medicaid . [facility] o Brandon, PA; Brecken, PA; Christy, NP; Dees, MD; DeClaire, MD; DeWeese, MD; Hansen, NP; Mills, NP; Parrish, NP; Smoot, NP; Summer, MD; Vapne, MD o [address] [place], [place] [address] o [phone] o Mon-Fri 8:30-5:00, Sat 10:00-1:00 o Providers come to see babies at [facility] o Does NOT accept Medicaid o Free prenatal information session Tuesdays at 4:45pm . [facility] o Bouska, MD; Gordon, PA; Jeffery, PA; Weber, PA o [address] [place] [address] o [phone] o Mon-Fri 7:30-5:30 o Babies seen by [facility] providers . [facility] o [address], [place], [place]  [address] o [phone]   Fax - [phone]  [place] ([address] & [address]) . [facility] o [place]ese, MD o [address] [place], [place] [address] o [phone] o Mon-Thur 8:00-6:00 o Providers come to see babies at [facility] o Accepting Medicaid . [facility] o Anderson, NP; Badger, MD; Beal, PA; Spencer, PA o [address] [place], [place] [address] o [phone] o Mon-Thur 7:30-7:30, Fri 7:30-4:30 o Babies seen by [facility] providers o Accepting Medicaid . [facility] o Agbuya, MD; Klett, NP; Romgoolam, MD o [address], [place], [place] [address] o [phone] o Mon-Fri 8:30-5:00, Sat 8:30-12:00 o Providers come to see babies at [facility] o Accepting Medicaid o Must have "Meet & Greet" appointment at office prior to delivery . [facility] [place] ([facility] o McCord, MD; Wallace, MD; Wood, MD o [address], [place], [place] [address] o [phone] o Mon-Wed 8:00-6:00, Thur-Fri 8:00-5:00, Sat 9:00-12:00 o Providers come to see babies at [facility] o Does NOT accept Medicaid o Only accepting siblings of current patients . [facility]  o [address], [place], [place]  [address] o [phone]   Fax - [phone] . [facility] o [address], [place], [place]  [address] o [phone]   Fax - [phone]  [place] ([address] & [address]) . [facility] o Cirigliano, DO; Matthews, DO o [address] [place], [place] [address] o [phone] o Mon-Fri 7:00-5:00 o Babies seen by [facility] providers o Does NOT accept Medicaid . [facility] o Briscoe, MD; Howley, PA; Moreira, PA o [address], [place], [place] [address] o [phone] o Mon-Fri 8:00-5:00 o Babies seen by [facility] providers o Accepting Medicaid . [facility] o Boyd, MD; Church, PA; Jones, NP; Osborn, PA o [address], [place], [place] [address] o [phone] o Mon-Fri 8:00-5:00 o Babies seen by providers at [facility] o   Accepting Medicaid  [place] ([address]) . [facility] [place] o Wendling, DO o [address] [place], [place] [address] o [phone] o Mon-Fri 8:00-5:00 o Babies seen by [facility] providers o Does NOT accept Medicaid o Limited availability, please call early in hospitalization to schedule follow-up . [facility] o Calderon, PA; Cummings, MD; Dillard, MD; Martin, PA; Olson, MD; VanDeven, PA o [address], [place], [place] [address] o [phone] o Mon-Fri 8:30-5:00, Sat 9:00-12:00 o Babies seen by providers at [facility] o Accepting Medicaid o Please register online then schedule online or call office o [email or web address] . [facility] ([facility] o Hunter, NP; Kumar, MD; Martin Rogers, PA o [address], [place], [place] [address] o [phone] o Mon-Fri 8:00-5:00 o Babies seen by providers at [facility] o Accepting Medicaid . [facility] ([facility] o [place], MD; Kristi Fleenor, NP; West, MD o [address], [place], [place] [address] o [phone] o Mon-Fri 8:00-5:30, Sat&Sun by  appointment (phones open at 8:30) o Babies seen by [facility] providers o Accepting Medicaid o Must be a first-time baby or sibling of current patient . [facility] [place]  o [address], [place], [place]  [address] o [phone]   Fax - [phone]  [place] ([address]) . [facility] o Brown, PA; Cowen, PA; Rice, MD; Helton, PA; Spry, MD o [address] [place] [address] [place], [place] [address] o [phone] o Mon-Thur 8:00-7:00, Fri 8:00-5:00, Sat 8:00-12:00, Sun 9:00-12:00 o Babies seen by [facility] providers o Accepting Medicaid . [facility] o Coe-Goins, MD; Marshall, MD; Pierre-Louis, MD o [address] [place] [address], [place], [place] [address] o [phone] o Mon-Thur 8:00-5:00 o Babies seen by providers at [facility] o Accepting Medicaid . [facility] o Bratton, MD; Coe-Goins, MD; Hayes, MD; Lewis, MD; List, MD; Lott, MD; Marshall, MD; Moran, MD; O'Neal, MD; Pierre-Louis, MD; Pitonzo, MD; Scholer, MD; Spangle, MD o [address] [place], [place] [address] o [phone] o Mon-Fri 8:00-5:30, Sat (Oct.-Mar.) 9:00-1:00 o Babies seen by providers at [facility] o Accepting Medicaid o Must fill out new patient packet, available online at [email or web address] . [facility] ([facility]) o Friddle, NP; Harris, NP; Kelly, NP; Logan, MD; Melvin, PA; Poth, MD; Ramadoss, MD; Stanton, NP o [address], [place], [place] [address] o [phone] o Mon-Thur 8:00-5:30, Fri 8:00-5:00 o Babies seen by providers at [facility] o Accepting Medicaid  [place] ([address]) . [facility] o Dixon, PA; [place], MD; Pickard, MD; Tapia, PA o [address], [place], [place] [address] o [phone] o Mon-Fri 8:00-5:00 o Babies seen  by providers at [facility] o Accepting Medicaid   [place] ([address]) . [facility] [place] o Masneri, DO; Meyers, MD; Nelson, PA o [address], [place], [place] [address] o [phone] o Mon-Fri 8:00-5:00 o Babies seen by providers at [facility] o Does NOT accept Medicaid o Limited appointment availability, please call early in hospitalization  .  [facility] [place] o Kunedd, DO; McGowen, MD o [address], [place],  [address] o [phone] o   Mon-Fri 8:00-5:00 o Babies seen by [facility] providers o Does NOT accept Medicaid . [facility] [place] o Cameron, MD; MacDonald, MD; Michaels, PA; Nayak, MD o [address] [place] [address], [place], [place] [address] o [phone] o Mon-Fri 8:00-5:00 o After hours clinic ([address] , [place] [address]) [phone] Mon-Fri 5:00-8:00, Sat 12:00-6:00, Sun 10:00-4:00 o Babies seen by [facility] providers o Accepting Medicaid . [facility] [place] o [address], [place], [place]  [address] o [phone]   Fax - [phone]  [place] ([address]) . [facility] [place] o Andy, MD o [address], [place], [place] [address] o [phone] o Mon-Fri 8:00-5:00 o Babies seen by [facility] providers o Does NOT accept Medicaid . [facility] [place] ([facility] [place]) o Eksir, MD o [address], [place], [place] [address] o [phone] o Mon-Thur 8:00-7:00, Fri 8:00-5:00, Sat 8:00-12:00 o Babies seen by providers at [facility] o Accepting Medicaid - but does not have vaccinations in office (must be received elsewhere) o Limited availability, please call early in hospitalization  [place] ([address]) . [facility]  o Charlene Flemming, MD o [address],   [address] o [phone]  Fax [phone]    

## 2019-08-16 ENCOUNTER — Encounter: Payer: Self-pay | Admitting: Medical

## 2019-08-16 DIAGNOSIS — Z3400 Encounter for supervision of normal first pregnancy, unspecified trimester: Secondary | ICD-10-CM

## 2019-08-17 MED ORDER — VITAFOL GUMMIES 3.33-0.333-34.8 MG PO CHEW: 3.0000 | CHEWABLE_TABLET | Freq: Every day | ORAL | 10 refills | Status: DC

## 2019-08-27 ENCOUNTER — Ambulatory Visit: Payer: Medicaid Other | Attending: Obstetrics and Gynecology

## 2019-08-27 ENCOUNTER — Ambulatory Visit: Payer: Medicaid Other | Admitting: *Deleted

## 2019-08-27 ENCOUNTER — Other Ambulatory Visit: Payer: Self-pay

## 2019-08-27 DIAGNOSIS — Z3400 Encounter for supervision of normal first pregnancy, unspecified trimester: Secondary | ICD-10-CM

## 2019-08-27 DIAGNOSIS — Z3A23 23 weeks gestation of pregnancy: Secondary | ICD-10-CM

## 2019-08-27 DIAGNOSIS — Z362 Encounter for other antenatal screening follow-up: Secondary | ICD-10-CM | POA: Insufficient documentation

## 2019-08-27 DIAGNOSIS — Z148 Genetic carrier of other disease: Secondary | ICD-10-CM | POA: Diagnosis not present

## 2019-08-27 DIAGNOSIS — Z1371 Encounter for nonprocreative screening for genetic disease carrier status: Secondary | ICD-10-CM

## 2019-09-07 ENCOUNTER — Telehealth: Payer: Self-pay | Admitting: General Practice

## 2019-09-07 NOTE — Telephone Encounter (Signed)
Left message on VM informing patient of follow up appt on 09/30/2019 at 0830 ROB & gtts.  [facility] message sent as well.

## 2019-09-30 ENCOUNTER — Ambulatory Visit (INDEPENDENT_AMBULATORY_CARE_PROVIDER_SITE_OTHER): Payer: Medicaid Other | Admitting: Obstetrics and Gynecology

## 2019-09-30 ENCOUNTER — Encounter: Payer: Self-pay | Admitting: General Practice

## 2019-09-30 ENCOUNTER — Other Ambulatory Visit: Payer: Self-pay

## 2019-09-30 ENCOUNTER — Other Ambulatory Visit (HOSPITAL_COMMUNITY)
Admission: RE | Admit: 2019-09-30 | Discharge: 2019-09-30 | Disposition: A | Discharge status: Home / Self Care | Payer: Medicaid Other | Source: Ambulatory Visit | Attending: Obstetrics and Gynecology | Admitting: Obstetrics and Gynecology

## 2019-09-30 VITALS — BP 109/70 | HR 76 | Temp 98.2°F | Wt 139.8 lb

## 2019-09-30 DIAGNOSIS — O26899 Other specified pregnancy related conditions, unspecified trimester: Secondary | ICD-10-CM | POA: Diagnosis present

## 2019-09-30 DIAGNOSIS — Z3400 Encounter for supervision of normal first pregnancy, unspecified trimester: Secondary | ICD-10-CM

## 2019-09-30 DIAGNOSIS — Z3A28 28 weeks gestation of pregnancy: Secondary | ICD-10-CM

## 2019-09-30 DIAGNOSIS — N898 Other specified noninflammatory disorders of vagina: Secondary | ICD-10-CM

## 2019-09-30 NOTE — Progress Notes (Signed)
   LOW-RISK PREGNANCY OFFICE VISIT Patient name: Karen Rojas MRN [id number]  Date of birth: 06-06-1995 Chief Complaint:   Routine Prenatal Visit  History of Present Illness:   Karen Rojas is a 24 y.o. G64P0000 female at [redacted]w[redacted]d with an Estimated Date of Delivery: 12/20/19 being seen today for ongoing management of a low-risk pregnancy.  Today she reports "leaked a teaspoon of fluid x 2 episodes 2 wks ago, but none since. She also complains of hemorrhoids and "occasional ? yeast infections". Contractions: Not present. Vag. Bleeding: None.  Movement: Present. Denies leaking of fluid today. Review of Systems:   Pertinent items are noted in HPI Denies abnormal vaginal discharge w/ itching/odor/irritation, headaches, visual changes, shortness of breath, chest pain, abdominal pain, severe nausea/vomiting, or problems with urination or bowel movements unless otherwise stated above. Pertinent History Reviewed:  Reviewed past medical,surgical, social, obstetrical and family history.  Reviewed problem list, medications and allergies. Physical Assessment:   Vitals:   09/30/19 0830  BP: 109/70  Pulse: 76  Temp: 98.2 F (36.8 C)  Weight: 139 lb 12.8 oz (63.4 kg)  Body mass index is 26.41 kg/m.        Physical Examination:   General appearance: Well appearing, and in no distress  Mental status: Alert, oriented to person, place, and time  Skin: Warm & dry  Cardiovascular: Normal heart rate noted  Respiratory: Normal respiratory effort, no distress  Abdomen: Soft, gravid, nontender  Pelvic: Cervical exam performed by speculum examDilation: Closed Effacement (%): Thick    Extremities: Edema: None  Fetal Status: Fetal Heart Rate (bpm): 138   Movement: Present    No results found for this or any previous visit (from the past 24 hour(s)).  Assessment & Plan:  1) Low-risk pregnancy G1P0000 at [redacted]w[redacted]d with an Estimated Date of Delivery: 12/20/19   2) Supervision of normal first pregnancy,  antepartum  - Glucose Tolerance, 2 Hours w/1 Hour,  - HIV Antibody (routine testing w rflx),  - RPR,  - CBC,   3) Vaginal discharge during pregnancy, antepartum  - Cervicovaginal ancillary only( [place])  4) [redacted] weeks gestation of pregnancy    Meds: No orders of the defined types were placed in this encounter.  Labs/procedures today: 2 hr GTT, 3rd trimester labs and wet prep  Plan:  Continue routine obstetrical care   Reviewed: Preterm labor symptoms and general obstetric precautions including but not limited to vaginal bleeding, contractions, leaking of fluid and fetal movement were reviewed in detail with the patient.  All questions were answered. Has home bp cuff. Check bp weekly, let us know if >140/90.   Follow-up: Return in about 4 weeks (around 10/28/2019) for Return OB - My Chart video.  Orders Placed This Encounter  Procedures  . Glucose Tolerance, 2 Hours w/1 Hour  . HIV Antibody (routine testing w rflx)  . RPR  . CBC   Raelyn Mora MSN, CNM 09/30/2019 8:37 AM

## 2019-10-01 ENCOUNTER — Encounter: Payer: Self-pay | Admitting: Obstetrics and Gynecology

## 2019-10-01 ENCOUNTER — Other Ambulatory Visit: Payer: Self-pay | Admitting: Obstetrics and Gynecology

## 2019-10-01 DIAGNOSIS — O99013 Anemia complicating pregnancy, third trimester: Secondary | ICD-10-CM

## 2019-10-01 DIAGNOSIS — O99019 Anemia complicating pregnancy, unspecified trimester: Secondary | ICD-10-CM | POA: Insufficient documentation

## 2019-10-01 LAB — CBC
Hematocrit: 28.4 % — ABNORMAL LOW (ref 34.0–46.6)
Hemoglobin: 9.5 g/dL — ABNORMAL LOW (ref 11.1–15.9)
MCH: 26 pg — ABNORMAL LOW (ref 26.6–33.0)
MCHC: 33.5 g/dL (ref 31.5–35.7)
MCV: 78 fL — ABNORMAL LOW (ref 79–97)
Platelets: 217 10*3/uL (ref 150–450)
RBC: 3.66 x10E6/uL — ABNORMAL LOW (ref 3.77–5.28)
RDW: 12.2 % (ref 11.7–15.4)
WBC: 7.6 10*3/uL (ref 3.4–10.8)

## 2019-10-01 LAB — CERVICOVAGINAL ANCILLARY ONLY
Bacterial Vaginitis (gardnerella): NEGATIVE
Candida Glabrata: NEGATIVE
Candida Vaginitis: NEGATIVE
Chlamydia: NEGATIVE
Comment: NEGATIVE
Comment: NEGATIVE
Comment: NEGATIVE
Comment: NEGATIVE
Comment: NEGATIVE
Comment: NORMAL
Neisseria Gonorrhea: NEGATIVE
Trichomonas: NEGATIVE

## 2019-10-01 LAB — HIV ANTIBODY (ROUTINE TESTING W REFLEX): HIV Screen 4th Generation wRfx: NONREACTIVE

## 2019-10-01 LAB — GLUCOSE TOLERANCE, 2 HOURS W/ 1HR
Glucose, 1 hour: 75 mg/dL (ref 65–179)
Glucose, 2 hour: 83 mg/dL (ref 65–152)
Glucose, Fasting: 71 mg/dL (ref 65–91)

## 2019-10-01 LAB — RPR: RPR Ser Ql: NONREACTIVE

## 2019-10-01 MED ORDER — ASCORBIC ACID 500 MG PO TABS: 500.0000 mg | ORAL_TABLET | ORAL | 3 refills | Status: DC

## 2019-10-01 MED ORDER — FERROUS SULFATE 325 (65 FE) MG PO TBEC: 325.0000 mg | DELAYED_RELEASE_TABLET | ORAL | 2 refills | Status: DC

## 2019-10-01 NOTE — Progress Notes (Signed)
Notified via My Chart

## 2019-10-28 ENCOUNTER — Telehealth (INDEPENDENT_AMBULATORY_CARE_PROVIDER_SITE_OTHER): Payer: Medicaid Other | Admitting: Medical

## 2019-10-28 ENCOUNTER — Encounter: Payer: Self-pay | Admitting: Medical

## 2019-10-28 VITALS — BP 122/67 | HR 86 | Wt 141.0 lb

## 2019-10-28 DIAGNOSIS — R102 Pelvic and perineal pain: Secondary | ICD-10-CM

## 2019-10-28 DIAGNOSIS — D649 Anemia, unspecified: Secondary | ICD-10-CM

## 2019-10-28 DIAGNOSIS — Z1371 Encounter for nonprocreative screening for genetic disease carrier status: Secondary | ICD-10-CM | POA: Diagnosis not present

## 2019-10-28 DIAGNOSIS — Z3400 Encounter for supervision of normal first pregnancy, unspecified trimester: Secondary | ICD-10-CM

## 2019-10-28 DIAGNOSIS — N949 Unspecified condition associated with female genital organs and menstrual cycle: Secondary | ICD-10-CM

## 2019-10-28 DIAGNOSIS — O99013 Anemia complicating pregnancy, third trimester: Secondary | ICD-10-CM | POA: Diagnosis not present

## 2019-10-28 DIAGNOSIS — O26893 Other specified pregnancy related conditions, third trimester: Secondary | ICD-10-CM

## 2019-10-28 DIAGNOSIS — Z3A32 32 weeks gestation of pregnancy: Secondary | ICD-10-CM

## 2019-10-28 MED ORDER — COMFORT FIT MATERNITY SUPP MED MISC: 1.0000 [IU] | Freq: Every day | 0 refills | Status: DC

## 2019-10-28 NOTE — Progress Notes (Signed)
I connected with Karen Rojas 10/28/19 at  3:30 PM EDT by: MyChart video and verified that I am speaking with the correct person using two identifiers.  [place] mall and provider is located at [facility].     The purpose of this virtual visit is to provide medical care while limiting exposure to the novel coronavirus. I discussed the limitations, risks, security and privacy concerns of performing an evaluation and management service by MyChart video and the availability of in person appointments. I also discussed with the patient that there may be a patient responsible charge related to this service. By engaging in this virtual visit, you consent to the provision of healthcare.  Additionally, you authorize for your insurance to be billed for the services provided during this visit.  The patient expressed understanding and agreed to proceed.  The following staff members participated in the virtual visit:  Dorisann Frames, RN    PRENATAL VISIT NOTE  Subjective:  Karen Rojas is a 24 y.o. G1P0000 at [redacted]w[redacted]d  for phone visit for ongoing prenatal care.  She is currently monitored for the following issues for this low-risk pregnancy and has Chlamydia; Supervision of normal first pregnancy, antepartum; Screening for genetic disease carrier status; and Anemia in pregnancy on their problem list.  Patient reports no complaints.  Contractions: Not present. Vag. Bleeding: None.  Movement: Present. Denies leaking of fluid.   The following portions of the patient's history were reviewed and updated as appropriate: allergies, current medications, past family history, past medical history, past social history, past surgical history and problem list.   Objective:   Vitals:   10/28/19 1526  BP: 122/67  Pulse: 86  Weight: 141 lb (64 kg)   Self-Obtained  Fetal Status:     Movement: Present     Assessment and Plan:  Pregnancy: G1P0000 at [redacted]w[redacted]d 1. 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 Supervision of normal first pregnancy, antepartum - Peds list given  3. Anemia during pregnancy in third trimester - Taking iron   4. Round ligament pain  - Patient describes feeling very heavy, Rx for abdominal binder sent [facility]   Preterm labor symptoms and general obstetric precautions including but not limited to vaginal bleeding, contractions, leaking of fluid and fetal movement were reviewed in detail with the patient.  Return in about 2 weeks (around 11/11/2019) for LOB, Virtual.  No future appointments.   Time spent on virtual visit: 10 minutes  Vonzella Nipple, PA-C

## 2019-10-28 NOTE — Patient Instructions (Addendum)
Fetal Movement Counts °Patient Name: ________________________________________________ Patient Due Date: ____________________ °What is a fetal movement count? ° °A fetal movement count is the number of times that you feel your baby move during a certain amount of time. This may also be called a fetal kick count. A fetal movement count is recommended for every pregnant woman. You may be asked to start counting fetal movements as early as week 28 of your pregnancy. °Pay attention to when your baby is most active. You may notice your baby's sleep and wake cycles. You may also notice things that make your baby move more. You should do a fetal movement count: °· When your baby is normally most active. °· At the same time each day. °A good time to count movements is while you are resting, after having something to eat and drink. °How do I count fetal movements? °1. Find a quiet, comfortable area. Sit, or lie down on your side. °2. Write down the date, the start time and stop time, and the number of movements that you felt between those two times. Take this information with you to your health care visits. °3. Write down your start time when you feel the first movement. °4. Count kicks, flutters, swishes, rolls, and jabs. You should feel at least 10 movements. °5. You may stop counting after you have felt 10 movements, or if you have been counting for 2 hours. Write down the stop time. °6. If you do not feel 10 movements in 2 hours, contact your health care provider for further instructions. Your health care provider may want to do additional tests to assess your baby's well-being. °Contact a health care provider if: °· You feel fewer than 10 movements in 2 hours. °· Your baby is not moving like he or she usually does. °Date: ____________ Start time: ____________ Stop time: ____________ Movements: ____________ °Date: ____________ Start time: ____________ Stop time: ____________ Movements: ____________ °Date: ____________  Start time: ____________ Stop time: ____________ Movements: ____________ °Date: ____________ Start time: ____________ Stop time: ____________ Movements: ____________ °Date: ____________ Start time: ____________ Stop time: ____________ Movements: ____________ °Date: ____________ Start time: ____________ Stop time: ____________ Movements: ____________ °Date: ____________ Start time: ____________ Stop time: ____________ Movements: ____________ °Date: ____________ Start time: ____________ Stop time: ____________ Movements: ____________ °Date: ____________ Start time: ____________ Stop time: ____________ Movements: ____________ °This information is not intended to replace advice given to you by your health care provider. Make sure you discuss any questions you have with your health care provider. °Document Revised: 09/03/2018 Document Reviewed: 09/03/2018 °Elsevier Patient Education © 2020 Elsevier Inc. °Braxton Hicks Contractions °Contractions of the uterus can occur throughout pregnancy, but they are not always a sign that you are in labor. You may have practice contractions called Braxton Hicks contractions. These false labor contractions are sometimes confused with true labor. °What are Braxton Hicks contractions? °Braxton Hicks contractions are tightening movements that occur in the muscles of the uterus before labor. Unlike true labor contractions, these contractions do not result in opening (dilation) and thinning of the cervix. Toward the end of pregnancy (32-34 weeks), Braxton Hicks contractions can happen more often and may become stronger. These contractions are sometimes difficult to tell apart from true labor because they can be very uncomfortable. You should not feel embarrassed if you go to the hospital with false labor. °Sometimes, the only way to tell if you are in true labor is for your health care provider to look for changes in the cervix. The health care provider   will do a physical exam and may  monitor your contractions. If you are not in true labor, the exam should show that your cervix is not dilating and your water has not broken. °If there are no other health problems associated with your pregnancy, it is completely safe for you to be sent home with false labor. You may continue to have Braxton Hicks contractions until you go into true labor. °How to tell the difference between true labor and false labor °True labor °· Contractions last 30-70 seconds. °· Contractions become very regular. °· Discomfort is usually felt in the top of the uterus, and it spreads to the lower abdomen and low back. °· Contractions do not go away with walking. °· Contractions usually become more intense and increase in frequency. °· The cervix dilates and gets thinner. °False labor °· Contractions are usually shorter and not as strong as true labor contractions. °· Contractions are usually irregular. °· Contractions are often felt in the front of the lower abdomen and in the groin. °· Contractions may go away when you walk around or change positions while lying down. °· Contractions get weaker and are shorter-lasting as time goes on. °· The cervix usually does not dilate or become thin. °Follow these instructions at home: ° °· Take over-the-counter and prescription medicines only as told by your health care provider. °· Keep up with your usual exercises and follow other instructions from your health care provider. °· Eat and drink lightly if you think you are going into labor. °· If Braxton Hicks contractions are making you uncomfortable: °? Change your position from lying down or resting to walking, or change from walking to resting. °? Sit and rest in a tub of warm water. °? Drink enough fluid to keep your urine pale yellow. Dehydration may cause these contractions. °? Do slow and deep breathing several times an hour. °· Keep all follow-up prenatal visits as told by your health care provider. This is important. °Contact a  health care provider if: °· You have a fever. °· You have continuous pain in your abdomen. °Get help right away if: °· Your contractions become stronger, more regular, and closer together. °· You have fluid leaking or gushing from your vagina. °· You pass blood-tinged mucus (bloody show). °· You have bleeding from your vagina. °· You have low back pain that you never had before. °· You feel your baby’s head pushing down and causing pelvic pressure. °· Your baby is not moving inside you as much as it used to. °Summary °· Contractions that occur before labor are called Braxton Hicks contractions, false labor, or practice contractions. °· Braxton Hicks contractions are usually shorter, weaker, farther apart, and less regular than true labor contractions. True labor contractions usually become progressively stronger and regular, and they become more frequent. °· Manage discomfort from Braxton Hicks contractions by changing position, resting in a warm bath, drinking plenty of water, or practicing deep breathing. °This information is not intended to replace advice given to you by your health care provider. Make sure you discuss any questions you have with your health care provider. °Document Revised: 12/27/2016 Document Reviewed: 05/30/2016 °Elsevier Patient Education © 2020 Elsevier Inc. ° °ARE[facility]ILY PRACTICE PHYSICI[facility] ° °[place] ([address]) °•  [facility] °o Chambliss, MD; Eniola, MD; Hale, MD; Hensel, MD; McDiarmid, MD; McIntyer, MD; Neal, MD; Walden, MD °o [address] [place], [place] [address] °o [phone] °o Mon-Fri 8:30-12:30, 1:30-5:00 °o Providers come to see babies   at [facility] °o Accepting Medicaid °• [facility] °o Limited providers who accept newborns: Koirala, MD; Morrow, MD; Wolters, MD °o [address], [place], [place] [address] °o [phone] °o Mon-Fri 8:00-5:30 °o Babies seen by providers at  [facility] °o Does NOT accept Medicaid °o Please call early in hospitalization for appointment (limited availability)  °• [facility] °o Mulberry, MD °o [address] [place], [place] [address] °o [phone] °o Mon, Tue, Thur, Fri 8:30-5:00, Wed 10:00-7:00 (closed 1-2pm) °o Babies seen by [facility] providers °o Accepting Medicaid °• [facility] °o Rubin, MD °o [address], [place], [place] [address] °o [phone] °o Mon-Fri 8:30-5:00, Sat 8:30-12:00 °o Provider comes to see babies at [facility] °o Accepting Medicaid °o Must have been referred from current patients or contacted office prior to delivery °• [facility] ([facility]) °o Brown, MD; Chandler, MD; Ettefagh, MD; Grant, MD; Lester, MD; McCormick, MD; McQueen, MD; Prose, MD; Simha, MD; Stanley, MD; Stryffeler, NP; Tebben, NP °o [address], [place], [place] [address] °o [phone] °o Mon, Tue, Thur, Fri 8:30-5:30, Wed 9:30-5:30, Sat 8:30-12:30 °o Babies seen by [facility] providers °o Accepting Medicaid °o Only accepting infants of first-time parents or siblings of current patients °o Hospital discharge coordinator will make follow-up appointment °• Jack Amos °o [address], [place], [place]  [address] °o [phone]   Fax - [phone] °• [facility] °o [address], [place], [place]  [address] °o Phone - [phone]   Fax - [phone] °• Shilpa Gosrani °o [address], [place], [place]  [address] °o [phone] ° °[place] ([address]) °• [facility] °o Bates, MD; Brassfield, MD; Cooper, Cox, MD; MD; Davis, MD; Dovico, MD; Ettefaugh, MD; Little, MD; Lowe, MD; Keiffer, MD; Melvin, MD; Sumner, MD; Williams, MD °o [address], [place], [place] [address] °o [phone] °o Mon-Fri 8:30-5:00 (extended evenings Mon-Thur as needed), Sat-Sun  10:00-1:00 °o Providers come to see babies at [facility] °o Accepting Medicaid for families of first-time babies and families with all children in the household age 3 and under. Must register with office prior to making appointment (M-F only). °• [facility] °o Henson, NP; Knapp, MD; Lalonde, MD; Tysinger, PA °o [address] [place],  [place] [address] °o [phone] °o Mon-Fri 8:00-5:00 °o Babies seen by providers at [facility] °o Does NOT accept Medicaid/Commercial Insurance Only °• [facility] [place] ([facility])  °o Artis, MD; Barnes, MD; Bratton, MD; Coccaro, MD; Lockett Gardner, MD; Kramer, MD; Marshall, MD; Netherton, MD; Poleto, MD; Skinner, MD °o [address] [place], [place] [address] °o [phone] °o Mon-Fri 8:30-5:30, Sat (Oct.-Mar.) 9:00-1:00 °o Babies seen by providers at [facility] °o Accepting Medicaid ° °[place] ([address]) °• [facility] °o Reid, MD; Warner, MD °o [address], , [place] [address] °o [phone] °o Mon-Fri 8:30-5:00, Sat 8:30-12:00 °o Providers come to see babies at [facility] °o Does NOT accept Medicaid °• [facility] °o Becker, PA; Hagler, MD; Scifres, PA; Sun, MD; Swayne, MD °o [address], , [place] [address] °o [phone] °o Mon-Fri 8:00-5:00 °o Babies seen by providers at [facility] °o Does NOT accept Medicaid °o Only accepting babies of parents who are patients °o Please call   early in hospitalization for appointment (limited availability) °• [facility] °o Clark, MD; Frye, MD; Kelleher, MD; Mack, NP; Miller, MD; O'Keller, MD; Patterson, NP; Pudlo, MD; Puzio, MD; Thomas, MD; Tucker, MD; Twiselton, MD °o [address], [place], [place] [address] °o [phone] °o Mon-Fri 8:00-5:00, Sat 9:00-12:00 °o Providers come to see babies at [facility] °o Does NOT accept  Medicaid ° °[place] ([address]) °• [facility] °o Limited providers accepting new patients: Brake, NP; Wharton, PA °o [address], [place], [place] [address] °o [phone] °o Mon-Fri 8:00-5:00 °o Babies seen by providers at [facility] °o Does NOT accept Medicaid °o Only accepting babies of parents who are patients °o Please call early in hospitalization for appointment (limited availability) °• [facility] °o Gay, MD; Quinlan, MD °o [address] [place], [place] [address] °o [phone] (press 1 to schedule appointment) °o Mon-Fri 8:00-5:00 °o Providers come to see babies at [facility] °o Does NOT accept Medicaid °• [facility] °o Mazer, MD °o [address] [place], [place] [address] °o [phone] °o Mon-Fri 8:30-5:00 (lunch 12:30-1:00), extended hours by appointment only Wed 5:00-6:30 °o Babies seen by [facility] providers °o Accepting Medicaid °• [facility] °o Banks, MD; Jordan, MD; Koberlein, MD °o [address], [place], [place] [address] °o [phone] °o Mon-Fri 8:00-5:00 °o Babies seen by [facility] providers °o Does NOT accept Medicaid °• [facility] °o Parker, MD; Hunter, MD; Wallace, DO °o [address] [place], [place] [address] °o [phone] °o Mon-Fri 8:00-5:00 °o Babies seen by [facility] providers °o Does NOT accept Medicaid °• [facility] °o Brandon, PA; Brecken, PA; Christy, NP; Dees, MD; DeClaire, MD; DeWeese, MD; Hansen, NP; Mills, NP; Parrish, NP; Smoot, NP; Summer, MD; Vapne, MD °o [address] [place], [place] [address] °o [phone] °o Mon-Fri 8:30-5:00, Sat 10:00-1:00 °o Providers come to see babies at [facility] °o Does NOT accept Medicaid °o Free prenatal information session Tuesdays at 4:45pm °• [facility] °o Bouska, MD; Gordon, PA; Jeffery, PA; Weber, PA °o [address] [place] [address] °o [phone] °o Mon-Fri 7:30-5:30 °o Babies seen by [facility] providers °• [facility] °o [address], [place], [place]  [address] °o [phone]   Fax - [phone] ° °[place] ([address]) °• [facility] °o Reese, MD °o [address] [place], [place] [address] °o [phone] °o Mon-Thur 8:00-6:00 °o Providers come to see babies at [facility] °o Accepting Medicaid °• [facility] °o Anderson, NP; Badger, MD; Beal, PA; Spencer, PA °o [address] [place], [place] [address] °o [phone] °o Mon-Thur 7:30-7:30, Fri 7:30-4:30 °o Babies seen by [facility] providers °o Accepting Medicaid °• [facility] °o Agbuya, MD; Klett, NP; Romgoolam, MD °o [address], [place], [place] [address] °o [phone] °o Mon-Fri 8:30-5:00, Sat 8:30-12:00 °o Providers come to see babies at [facility] °o Accepting Medicaid °o Must have “Meet & Greet” appointment at office prior to delivery °• [facility] ([facility]) °o McCord, MD; Wallace, MD; Wood, MD °o [address], [place], [place] [address] °o [phone] °o Mon-Wed 8:00-6:00, Thur-Fri 8:00-5:00, Sat 9:00-12:00 °o Providers come to see babies at [facility] °o Does NOT accept Medicaid °o Only accepting siblings of current patients °• [facility]   °  o [address], [place], [place]  [address] °o [phone]   Fax - [phone] °• [facility] °o [address], [place], [place]  [address] °o [phone]   Fax - [phone] ° °[place] ([address]) °• [facility] °o Cirigliano, DO; Matthews, DO °o [address] [place], [place] [address] °o [phone] °o Mon-Fri 7:00-5:00 °o Babies seen by [facility] providers °o Does NOT  accept Medicaid °• [facility] °o Briscoe, MD; Howley, PA; Moreira, PA °o [address], [place], [place] [address] °o [phone] °o Mon-Fri 8:00-5:00 °o Babies seen by [facility] providers °o Accepting Medicaid °• [facility] °o Boyd, MD; Church, PA; Jones, NP; Osborn, PA °o [address], [place], [place] [address] °o [phone] °o Mon-Fri 8:00-5:00 °o Babies seen by providers at [facility] °o Accepting Medicaid ° °[place] ([address]) °• [facility] [place] °o Wendling, DO °o [address] [place], [place] [address] °o [phone] °o Mon-Fri 8:00-5:00 °o Babies seen by [facility] providers °o Does NOT accept Medicaid °o Limited availability, please call early in hospitalization to schedule follow-up °• [facility] °o Calderon, PA; Cummings, MD; Dillard, MD; Martin, PA; Olson, MD; VanDeven, PA °o [address], [place], [place] [address] °o [phone] °o Mon-Fri 8:30-5:00, Sat 9:00-12:00 °o Babies seen by providers at [facility] °o Accepting Medicaid °o Please register online then schedule online or call office °o [email or web address] °• [facility] ([facility] °o Hunter, NP; Kumar, MD; Martin Rogers, PA °o [address], [place], [place] [address] °o [phone] °o Mon-Fri 8:00-5:00 °o Babies seen by providers at [facility] °o Accepting Medicaid °• [facility] ([facility] °o Van Alstyne, MD; Kristi Fleenor, NP; West, MD °o [address], [place], [place] [address] °o [phone] °o Mon-Fri 8:00-5:30, Sat&Sun by appointment (phones open at 8:30) °o Babies seen by [facility] providers °o Accepting Medicaid °o Must be a first-time baby or sibling of current patient °• [facility] [place]  °o [address], [place], [place]  [address] °o [phone]   Fax - [phone] ° °[place] ([address]) °• [facility] °o Brown, PA; Cowen, PA; Rice, MD; Helton, PA; Spry, MD °o [address] [place], [place] [address] °o [phone] °o Mon-Thur 8:00-7:00, Fri 8:00-5:00, Sat 8:00-12:00, Sun 9:00-12:00 °o Babies seen by [facility] providers °o Accepting Medicaid °• [facility] [place] °o Coe-Goins, MD; Marshall, MD; Pierre-Louis, MD °o [address] [place], [place], [place] [address] °o [phone] °o Mon-Thur 8:00-5:00 °o Babies seen by providers at [facility] °o Accepting Medicaid °• [facility] °o Bratton, MD; Coe-Goins, MD; Hayes, MD; Lewis, MD; List, MD; Lott, MD; Marshall, MD; Moran, MD; O'Neal, MD; Pierre-Louis, MD; Pitonzo, MD; Scholer, MD; Spangle, MD °o [address] [place], [place] [address] °o [phone] °o Mon-Fri 8:00-5:30, Sat (Oct.-Mar.) 9:00-1:00 °o Babies seen by providers at [facility] °o Accepting Medicaid °o Must fill out new patient packet, available online at [email or web address] °• [facility] ([facility]) °o Friddle, NP; Harris, NP; Kelly, NP; Logan,   MD; Melvin, PA; Poth, MD; Ramadoss, MD; Stanton, NP °o [address], [place], [place] [address] °o [phone] °o Mon-Thur 8:00-5:30, Fri 8:00-5:00 °o Babies seen by providers at [facility] °o Accepting Medicaid ° °[place] ([address]) °• [facility] °o Dixon, PA; [place], MD; Pickard, MD; Tapia, PA °o [address], [place], [place] [address] °o [phone] °o Mon-Fri 8:00-5:00 °o Babies seen by providers at [facility] °o Accepting Medicaid  ° °[place] ([address]) °• [facility] [place] °o Masneri, DO; Meyers, MD; Nelson, PA °o [address], [place], [place]  [address] °o [phone] °o Mon-Fri 8:00-5:00 °o Babies seen by providers at [facility] °o Does NOT accept Medicaid °o Limited appointment availability, please call early in hospitalization ° °• [facility] [place] °o Kunedd, DO; McGowen, MD °o [address], [place], [place] [address] °o [phone] °o Mon-Fri 8:00-5:00 °o Babies seen by [facility] providers °o Does NOT accept Medicaid °• [facility] [place] °o Cameron, MD; MacDonald, MD; Michaels, PA; Nayak, MD °o [address] [place] [address], [place], [place] [address] °o [phone] °o Mon-Fri 8:00-5:00 °o After hours clinic ([address] [place], [place] [address]) [phone] Mon-Fri 5:00-8:00, Sat 12:00-6:00, Sun 10:00-4:00 °o Babies seen by [facility] providers °o Accepting Medicaid °• [facility] [place] °o [address], [place], [place]  [address] °o [phone]   Fax - [phone] ° °[place] ([address]) °• [facility] [place] °o Andy, MD °o [address], [place], [place] [address] °o [phone] °o Mon-Fri 8:00-5:00 °o Babies seen by [facility] providers °o Does NOT accept Medicaid °• [facility] [place] ([facility] [place]) °o Eksir, MD °o [address], [place], [place] [address] °o [phone] °o Mon-Thur 8:00-7:00, Fri 8:00-5:00, Sat 8:00-12:00 °o Babies seen by providers at [facility] °o Accepting Medicaid - but does not have vaccinations in office (must be received elsewhere) °o Limited availability, please call early in hospitalization ° °[place] ([address]) °• [facility]  °o Charlene Flemming, MD °o [address], [place] [address] °o [phone]  Fax [phone] ° ° °

## 2019-11-10 ENCOUNTER — Telehealth: Payer: Medicaid Other | Admitting: Certified Nurse Midwife

## 2019-11-25 ENCOUNTER — Encounter: Payer: Self-pay | Admitting: Obstetrics and Gynecology

## 2019-11-25 ENCOUNTER — Other Ambulatory Visit: Payer: Self-pay

## 2019-11-25 ENCOUNTER — Other Ambulatory Visit (HOSPITAL_COMMUNITY)
Admission: RE | Admit: 2019-11-25 | Discharge: 2019-11-25 | Disposition: A | Discharge status: Home / Self Care | Payer: Medicaid Other | Source: Ambulatory Visit | Attending: Obstetrics and Gynecology | Admitting: Obstetrics and Gynecology

## 2019-11-25 ENCOUNTER — Ambulatory Visit (INDEPENDENT_AMBULATORY_CARE_PROVIDER_SITE_OTHER): Payer: Medicaid Other | Admitting: Obstetrics and Gynecology

## 2019-11-25 VITALS — BP 115/74 | HR 76 | Temp 98.3°F | Wt 147.2 lb

## 2019-11-25 DIAGNOSIS — Z3400 Encounter for supervision of normal first pregnancy, unspecified trimester: Secondary | ICD-10-CM

## 2019-11-25 NOTE — Progress Notes (Signed)
   LOW-RISK PREGNANCY OFFICE VISIT Patient name: Karen Rojas MRN [id number]  Date of birth: 06-19-1995 Chief Complaint:   Routine Prenatal Visit  History of Present Illness:   Karen Rojas is a 24 y.o. G19P0000 female at [redacted]w[redacted]d with an Estimated Date of Delivery: 12/20/19 being seen today for ongoing management of a low-risk pregnancy.  Today she reports no complaints. Contractions: Not present. Vag. Bleeding: None.  Movement: Present. denies leaking of fluid. Review of Systems:   Pertinent items are noted in HPI Denies abnormal vaginal discharge w/ itching/odor/irritation, headaches, visual changes, shortness of breath, chest pain, abdominal pain, severe nausea/vomiting, or problems with urination or bowel movements unless otherwise stated above. Pertinent History Reviewed:  Reviewed past medical,surgical, social, obstetrical and family history.  Reviewed problem list, medications and allergies. Physical Assessment:   Vitals:   11/25/19 1509  BP: 115/74  Pulse: 76  Temp: 98.3 F (36.8 C)  Weight: 147 lb 3.2 oz (66.8 kg)  Body mass index is 27.81 kg/m.        Physical Examination:   General appearance: Well appearing, and in no distress  Mental status: Alert, oriented to person, place, and time  Skin: Warm & dry  Cardiovascular: Normal heart rate noted  Respiratory: Normal respiratory effort, no distress  Abdomen: Soft, gravid, nontender  Pelvic: Cervical exam deferred         Extremities: Edema: None  Fetal Status: Fetal Heart Rate (bpm): 140   Movement: Present    No results found for this or any previous visit (from the past 24 hour(s)).  Assessment & Plan:  1) Low-risk pregnancy G1P0000 at [redacted]w[redacted]d with an Estimated Date of Delivery: 12/20/19   2) Supervision of normal first pregnancy, antepartum  - Culture, beta strep (group b only),  - Cervicovaginal ancillary only( [place])    Meds: No orders of the defined types were placed in this  encounter.  Labs/procedures today: none  Plan:  Continue routine obstetrical care   Reviewed: Preterm labor symptoms and general obstetric precautions including but not limited to vaginal bleeding, contractions, leaking of fluid and fetal movement were reviewed in detail with the patient.  All questions were answered. Has home bp cuff. Check bp weekly, let us know if >140/90.   Follow-up: Return in about 2 weeks (around 12/09/2019) for Return OB visit.  Orders Placed This Encounter  Procedures  . Culture, beta strep (group b only)   Raelyn Mora MSN, CNM 11/25/2019 3:35 PM

## 2019-11-26 LAB — CERVICOVAGINAL ANCILLARY ONLY
Bacterial Vaginitis (gardnerella): NEGATIVE
Candida Glabrata: NEGATIVE
Candida Vaginitis: POSITIVE — AB
Chlamydia: NEGATIVE
Comment: NEGATIVE
Comment: NEGATIVE
Comment: NEGATIVE
Comment: NEGATIVE
Comment: NEGATIVE
Comment: NORMAL
Neisseria Gonorrhea: NEGATIVE
Trichomonas: NEGATIVE

## 2019-11-29 ENCOUNTER — Telehealth: Payer: Self-pay | Admitting: *Deleted

## 2019-11-29 DIAGNOSIS — B379 Candidiasis, unspecified: Secondary | ICD-10-CM

## 2019-11-29 LAB — CULTURE, BETA STREP (GROUP B ONLY): Strep Gp B Culture: NEGATIVE

## 2019-11-29 MED ORDER — TERCONAZOLE 0.4 % VA CREA: 1.0000 | TOPICAL_CREAM | Freq: Every day | VAGINAL | 0 refills | Status: DC

## 2019-11-29 NOTE — Telephone Encounter (Signed)
-----   Message from Raelyn Mora, [place] sent at 11/28/2019 12:45 PM EDT ----- Please treat for yeast

## 2019-12-02 ENCOUNTER — Encounter: Payer: Medicaid Other | Admitting: Obstetrics and Gynecology

## 2019-12-09 ENCOUNTER — Encounter: Payer: Medicaid Other | Admitting: Obstetrics and Gynecology

## 2019-12-16 ENCOUNTER — Other Ambulatory Visit: Payer: Self-pay

## 2019-12-16 ENCOUNTER — Encounter: Payer: Self-pay | Admitting: Obstetrics and Gynecology

## 2019-12-16 ENCOUNTER — Ambulatory Visit (INDEPENDENT_AMBULATORY_CARE_PROVIDER_SITE_OTHER): Payer: Medicaid Other | Admitting: Obstetrics and Gynecology

## 2019-12-16 VITALS — BP 115/75 | HR 101 | Wt 153.0 lb

## 2019-12-16 DIAGNOSIS — Z3400 Encounter for supervision of normal first pregnancy, unspecified trimester: Secondary | ICD-10-CM

## 2019-12-16 DIAGNOSIS — Z3A39 39 weeks gestation of pregnancy: Secondary | ICD-10-CM

## 2019-12-16 NOTE — Progress Notes (Signed)
   LOW-RISK PREGNANCY OFFICE VISIT Patient name: Karen Rojas MRN [id number]  Date of birth: 11-25-1995 Chief Complaint:   No chief complaint on file.  History of Present Illness:   Karen Rojas is a 24 y.o. G29P0000 female at [redacted]w[redacted]d with an Estimated Date of Delivery: 12/20/19 being seen today for ongoing management of a low-risk pregnancy.  Today she reports occasional contractions. Contractions: Irregular. Vag. Bleeding: None.  Movement: Present. denies leaking of fluid. Review of Systems:   Pertinent items are noted in HPI Denies abnormal vaginal discharge w/ itching/odor/irritation, headaches, visual changes, shortness of breath, chest pain, abdominal pain, severe nausea/vomiting, or problems with urination or bowel movements unless otherwise stated above. Pertinent History Reviewed:  Reviewed past medical,surgical, social, obstetrical and family history.  Reviewed problem list, medications and allergies. Physical Assessment:   Vitals:   12/16/19 1409  BP: 115/75  Pulse: (!) 101  Weight: 153 lb (69.4 kg)  Body mass index is 28.91 kg/m.        Physical Examination:   General appearance: Well appearing, and in no distress  Mental status: Alert, oriented to person, place, and time  Skin: Warm & dry  Cardiovascular: Normal heart rate noted  Respiratory: Normal respiratory effort, no distress  Abdomen: Soft, gravid, nontender  Pelvic: Cervical exam performed  Dilation: Closed Effacement (%): Thick Station: -3  Extremities: Edema: None  Fetal Status: Fetal Heart Rate (bpm): 150 Fundal Height: 36 cm Movement: Present Presentation: Vertex  No results found for this or any previous visit (from the past 24 hour(s)).  Assessment & Plan:  1) Low-risk pregnancy G1P0000 at [redacted]w[redacted]d with an Estimated Date of Delivery: 12/20/19   2) Supervision of normal first pregnancy, antepartum - IOL scheduled for 12/27/2019 - Advised to await phone call from L&D charge RN on the date of IOL  3) [redacted]  weeks gestation of pregnancy    Meds: No orders of the defined types were placed in this encounter.  Labs/procedures today: cervical exam  Plan:  Continue routine obstetrical care   Reviewed: Term labor symptoms and general obstetric precautions including but not limited to vaginal bleeding, contractions, leaking of fluid and fetal movement were reviewed in detail with the patient.  All questions were answered. Has home bp cuff. Check bp weekly, let us know if >140/90.   Follow-up: Return in about 1 week (around 12/23/2019) for Return OB visit.  No orders of the defined types were placed in this encounter.  Raelyn Mora MSN, CNM 12/16/2019 2:35 PM

## 2019-12-17 ENCOUNTER — Other Ambulatory Visit (INDEPENDENT_AMBULATORY_CARE_PROVIDER_SITE_OTHER): Payer: Medicaid Other | Admitting: Obstetrics and Gynecology

## 2019-12-17 ENCOUNTER — Telehealth (HOSPITAL_COMMUNITY): Payer: Self-pay | Admitting: *Deleted

## 2019-12-17 NOTE — Telephone Encounter (Signed)
Preadmission screen  

## 2019-12-17 NOTE — Progress Notes (Signed)
IOL orders entered 

## 2019-12-18 ENCOUNTER — Other Ambulatory Visit: Payer: Self-pay | Admitting: Advanced Practice Midwife

## 2019-12-20 ENCOUNTER — Telehealth (HOSPITAL_COMMUNITY): Payer: Self-pay | Admitting: *Deleted

## 2019-12-20 ENCOUNTER — Encounter (HOSPITAL_COMMUNITY): Payer: Self-pay | Admitting: *Deleted

## 2019-12-20 NOTE — Telephone Encounter (Signed)
Preadmission screen  

## 2019-12-22 ENCOUNTER — Other Ambulatory Visit: Payer: Self-pay

## 2019-12-22 ENCOUNTER — Encounter: Payer: Self-pay | Admitting: Obstetrics and Gynecology

## 2019-12-22 ENCOUNTER — Ambulatory Visit (INDEPENDENT_AMBULATORY_CARE_PROVIDER_SITE_OTHER): Payer: Medicaid Other | Admitting: Obstetrics and Gynecology

## 2019-12-22 VITALS — BP 124/78 | HR 91 | Temp 98.4°F | Wt 153.8 lb

## 2019-12-22 DIAGNOSIS — Z3400 Encounter for supervision of normal first pregnancy, unspecified trimester: Secondary | ICD-10-CM

## 2019-12-22 DIAGNOSIS — Z3A40 40 weeks gestation of pregnancy: Secondary | ICD-10-CM

## 2019-12-22 NOTE — Patient Instructions (Signed)

## 2019-12-22 NOTE — Progress Notes (Signed)
   LOW-RISK PREGNANCY OFFICE VISIT Patient name: Karen Rojas MRN [id number]  Date of birth: 05-Jun-1995 Chief Complaint:   Routine Prenatal Visit  History of Present Illness:   Karen Rojas is a 24 y.o. G38P0000 female at [redacted]w[redacted]d with an Estimated Date of Delivery: 12/20/19 being seen today for ongoing management of a low-risk pregnancy.  Today she reports occasional contractions. Contractions: Irregular. Vag. Bleeding: None.  Movement: Present. denies leaking of fluid. Review of Systems:   Pertinent items are noted in HPI Denies abnormal vaginal discharge w/ itching/odor/irritation, headaches, visual changes, shortness of breath, chest pain, abdominal pain, severe nausea/vomiting, or problems with urination or bowel movements unless otherwise stated above. Pertinent History Reviewed:  Reviewed past medical,surgical, social, obstetrical and family history.  Reviewed problem list, medications and allergies. Physical Assessment:   Vitals:   12/22/19 1312  BP: 124/78  Pulse: 91  Temp: 98.4 F (36.9 C)  Weight: 153 lb 12.8 oz (69.8 kg)  Body mass index is 29.06 kg/m.        Physical Examination:   General appearance: Well appearing, and in no distress  Mental status: Alert, oriented to person, place, and time  Skin: Warm & dry  Cardiovascular: Normal heart rate noted  Respiratory: Normal respiratory effort, no distress  Abdomen: Soft, gravid, nontender  Pelvic: Cervical exam performed  Dilation: Fingertip Effacement (%): Thick Station: -3  Extremities: Edema: None  Fetal Status: Fetal Heart Rate (bpm): 153 Fundal Height: 38 cm Movement: Present Presentation: Vertex  No results found for this or any previous visit (from the past 24 hour(s)).  Assessment & Plan:  1) Low-risk pregnancy G1P0000 at [redacted]w[redacted]d with an Estimated Date of Delivery: 12/20/19   2)Supervision of normal first pregnancy, antepartum - Information provided on [employer] - Advised to continue nipple stim, SI and  walking to get labor started   3) [redacted] weeks gestation of pregnancy     Meds: No orders of the defined types were placed in this encounter.  Labs/procedures today: cervical exam  Plan:  Continue routine obstetrical care   Reviewed: Term labor symptoms and general obstetric precautions including but not limited to vaginal bleeding, contractions, leaking of fluid and fetal movement were reviewed in detail with the patient.  All questions were answered. Has home bp cuff. Check bp weekly, let us know if >140/90.   Follow-up: No follow-ups on file.  No orders of the defined types were placed in this encounter.  Raelyn Mora MSN, CNM 12/22/2019 1:28 PM

## 2019-12-25 ENCOUNTER — Other Ambulatory Visit (HOSPITAL_COMMUNITY): Admission: RE | Admit: 2019-12-25 | Payer: Medicaid Other | Source: Ambulatory Visit

## 2019-12-27 ENCOUNTER — Inpatient Hospital Stay (HOSPITAL_COMMUNITY): Payer: Medicaid Other | Admitting: Anesthesiology

## 2019-12-27 ENCOUNTER — Inpatient Hospital Stay (HOSPITAL_COMMUNITY): Payer: Medicaid Other

## 2019-12-27 ENCOUNTER — Other Ambulatory Visit: Payer: Self-pay

## 2019-12-27 ENCOUNTER — Encounter (HOSPITAL_COMMUNITY): Payer: Self-pay | Admitting: Obstetrics and Gynecology

## 2019-12-27 ENCOUNTER — Inpatient Hospital Stay (HOSPITAL_COMMUNITY)
Admission: AD | Admit: 2019-12-27 | Discharge: 2019-12-30 | DRG: 805 | Disposition: A | Discharge status: Home / Self Care | Payer: Medicaid Other | Attending: Obstetrics & Gynecology | Admitting: Obstetrics & Gynecology

## 2019-12-27 DIAGNOSIS — Z87891 Personal history of nicotine dependence: Secondary | ICD-10-CM | POA: Diagnosis not present

## 2019-12-27 DIAGNOSIS — O1404 Mild to moderate pre-eclampsia, complicating childbirth: Secondary | ICD-10-CM | POA: Diagnosis present

## 2019-12-27 DIAGNOSIS — O1403 Mild to moderate pre-eclampsia, third trimester: Secondary | ICD-10-CM | POA: Diagnosis not present

## 2019-12-27 DIAGNOSIS — D649 Anemia, unspecified: Secondary | ICD-10-CM | POA: Diagnosis present

## 2019-12-27 DIAGNOSIS — J45909 Unspecified asthma, uncomplicated: Secondary | ICD-10-CM | POA: Diagnosis present

## 2019-12-27 DIAGNOSIS — Z3A41 41 weeks gestation of pregnancy: Secondary | ICD-10-CM

## 2019-12-27 DIAGNOSIS — O9952 Diseases of the respiratory system complicating childbirth: Secondary | ICD-10-CM | POA: Diagnosis present

## 2019-12-27 DIAGNOSIS — O411290 Chorioamnionitis, unspecified trimester, not applicable or unspecified: Secondary | ICD-10-CM | POA: Diagnosis not present

## 2019-12-27 DIAGNOSIS — Z20822 Contact with and (suspected) exposure to covid-19: Secondary | ICD-10-CM | POA: Diagnosis present

## 2019-12-27 DIAGNOSIS — O99019 Anemia complicating pregnancy, unspecified trimester: Secondary | ICD-10-CM | POA: Diagnosis present

## 2019-12-27 DIAGNOSIS — Z1371 Encounter for nonprocreative screening for genetic disease carrier status: Secondary | ICD-10-CM

## 2019-12-27 DIAGNOSIS — O9902 Anemia complicating childbirth: Secondary | ICD-10-CM | POA: Diagnosis present

## 2019-12-27 DIAGNOSIS — Z3400 Encounter for supervision of normal first pregnancy, unspecified trimester: Secondary | ICD-10-CM

## 2019-12-27 DIAGNOSIS — O480 Post-term pregnancy: Secondary | ICD-10-CM | POA: Diagnosis present

## 2019-12-27 DIAGNOSIS — O4202 Full-term premature rupture of membranes, onset of labor within 24 hours of rupture: Secondary | ICD-10-CM | POA: Diagnosis not present

## 2019-12-27 DIAGNOSIS — O411230 Chorioamnionitis, third trimester, not applicable or unspecified: Secondary | ICD-10-CM | POA: Diagnosis present

## 2019-12-27 DIAGNOSIS — O99013 Anemia complicating pregnancy, third trimester: Secondary | ICD-10-CM

## 2019-12-27 LAB — CBC
HCT: 34.5 % — ABNORMAL LOW (ref 36.0–46.0)
Hemoglobin: 10.5 g/dL — ABNORMAL LOW (ref 12.0–15.0)
MCH: 24.4 pg — ABNORMAL LOW (ref 26.0–34.0)
MCHC: 30.4 g/dL (ref 30.0–36.0)
MCV: 80 fL (ref 80.0–100.0)
Platelets: 189 10*3/uL (ref 150–400)
RBC: 4.31 MIL/uL (ref 3.87–5.11)
RDW: 13.6 % (ref 11.5–15.5)
WBC: 7.4 10*3/uL (ref 4.0–10.5)
nRBC: 0 % (ref 0.0–0.2)

## 2019-12-27 LAB — TYPE AND SCREEN
ABO/RH(D): O POS
Antibody Screen: NEGATIVE

## 2019-12-27 LAB — RESP PANEL BY RT-PCR (FLU A&B, COVID) ARPGX2
Influenza A by PCR: NEGATIVE
Influenza B by PCR: NEGATIVE
SARS Coronavirus 2 by RT PCR: NEGATIVE

## 2019-12-27 LAB — RPR: RPR Ser Ql: NONREACTIVE

## 2019-12-27 MED ORDER — OXYTOCIN-SODIUM CHLORIDE 30-0.9 UT/500ML-% IV SOLN
1.0000 m[IU]/min | INTRAVENOUS | Status: DC
  Filled 2019-12-27: qty 500

## 2019-12-27 MED ORDER — OXYTOCIN-SODIUM CHLORIDE 30-0.9 UT/500ML-% IV SOLN
1.0000 m[IU]/min | INTRAVENOUS | Status: DC
  Administered 2019-12-27: 1 m[IU]/min via INTRAVENOUS

## 2019-12-27 MED ORDER — FENTANYL-BUPIVACAINE-NACL 0.5-0.125-0.9 MG/250ML-% EP SOLN
12.0000 mL/h | EPIDURAL | Status: DC | PRN
  Administered 2019-12-28: 12 mL/h via EPIDURAL
  Filled 2019-12-27 (×2): qty 250

## 2019-12-27 MED ORDER — PHENYLEPHRINE 40 MCG/ML (10ML) SYRINGE FOR IV PUSH (FOR BLOOD PRESSURE SUPPORT): 80.0000 ug | PREFILLED_SYRINGE | INTRAVENOUS | Status: DC | PRN

## 2019-12-27 MED ORDER — LACTATED RINGERS IV SOLN: INTRAVENOUS | Status: DC

## 2019-12-27 MED ORDER — TERBUTALINE SULFATE 1 MG/ML IJ SOLN
0.2500 mg | Freq: Once | INTRAMUSCULAR | Status: AC | PRN
  Administered 2019-12-28: 0.25 mg via SUBCUTANEOUS
  Filled 2019-12-27: qty 1

## 2019-12-27 MED ORDER — EPHEDRINE 5 MG/ML INJ: 10.0000 mg | INTRAVENOUS | Status: DC | PRN

## 2019-12-27 MED ORDER — PHENYLEPHRINE 40 MCG/ML (10ML) SYRINGE FOR IV PUSH (FOR BLOOD PRESSURE SUPPORT)
80.0000 ug | PREFILLED_SYRINGE | INTRAVENOUS | Status: DC | PRN
  Filled 2019-12-27: qty 10

## 2019-12-27 MED ORDER — ACETAMINOPHEN 325 MG PO TABS: 650.0000 mg | ORAL_TABLET | ORAL | Status: DC | PRN

## 2019-12-27 MED ORDER — SOD CITRATE-CITRIC ACID 500-334 MG/5ML PO SOLN: 30.0000 mL | ORAL | Status: DC | PRN

## 2019-12-27 MED ORDER — OXYCODONE-ACETAMINOPHEN 5-325 MG PO TABS: 1.0000 | ORAL_TABLET | ORAL | Status: DC | PRN

## 2019-12-27 MED ORDER — OXYTOCIN BOLUS FROM INFUSION
333.0000 mL | Freq: Once | INTRAVENOUS | Status: AC
  Administered 2019-12-28: 333 mL via INTRAVENOUS

## 2019-12-27 MED ORDER — MISOPROSTOL 50MCG HALF TABLET
50.0000 ug | ORAL_TABLET | ORAL | Status: DC | PRN
  Administered 2019-12-27 (×2): 50 ug via BUCCAL
  Filled 2019-12-27 (×2): qty 1

## 2019-12-27 MED ORDER — ONDANSETRON HCL 4 MG/2ML IJ SOLN
4.0000 mg | Freq: Four times a day (QID) | INTRAMUSCULAR | Status: DC | PRN
  Administered 2019-12-28: 4 mg via INTRAVENOUS
  Filled 2019-12-27: qty 2

## 2019-12-27 MED ORDER — DIPHENHYDRAMINE HCL 50 MG/ML IJ SOLN: 12.5000 mg | INTRAMUSCULAR | Status: DC | PRN

## 2019-12-27 MED ORDER — LACTATED RINGERS IV SOLN: 500.0000 mL | INTRAVENOUS | Status: DC | PRN

## 2019-12-27 MED ORDER — MISOPROSTOL 25 MCG QUARTER TABLET
25.0000 ug | ORAL_TABLET | ORAL | Status: DC
  Administered 2019-12-27: 25 ug via VAGINAL
  Filled 2019-12-27 (×2): qty 1

## 2019-12-27 MED ORDER — LIDOCAINE HCL (PF) 1 % IJ SOLN: 30.0000 mL | INTRAMUSCULAR | Status: DC | PRN

## 2019-12-27 MED ORDER — FENTANYL CITRATE (PF) 100 MCG/2ML IJ SOLN
100.0000 ug | INTRAMUSCULAR | Status: DC | PRN
  Administered 2019-12-27 (×2): 100 ug via INTRAVENOUS
  Filled 2019-12-27 (×2): qty 2

## 2019-12-27 MED ORDER — OXYCODONE-ACETAMINOPHEN 5-325 MG PO TABS: 2.0000 | ORAL_TABLET | ORAL | Status: DC | PRN

## 2019-12-27 MED ORDER — OXYTOCIN 10 UNIT/ML IJ SOLN: 10.0000 [IU] | Freq: Once | INTRAMUSCULAR | Status: DC | PRN

## 2019-12-27 MED ORDER — OXYTOCIN-SODIUM CHLORIDE 30-0.9 UT/500ML-% IV SOLN: 2.5000 [IU]/h | INTRAVENOUS | Status: DC

## 2019-12-27 MED ORDER — LACTATED RINGERS IV SOLN
500.0000 mL | Freq: Once | INTRAVENOUS | Status: AC
  Administered 2019-12-27: 500 mL via INTRAVENOUS

## 2019-12-27 NOTE — Progress Notes (Signed)
Labor Progress Note Karen Rojas is a 24 y.o. G1P0000 at [redacted]w[redacted]d presented for post dates induction.  S: Patient is doing well. Resting comfortably in the bed.   O:  BP 114/71   Pulse 64   Temp 98.6 F (37 C) Comment (Src): oral  Ht 5\' 2"  (1.575 m)   Wt 69.4 kg   LMP 02/05/2019 (Within Months)   BMI 27.98 kg/m  EFM: 135/moderate variability/+accels, no decels  CVE: Dilation: Fingertip Effacement (%): 50 Station: -2 Presentation: Vertex Exam by:: J.Cox, RN   A&P: 24 y.o. G1P0000 [redacted]w[redacted]d here for post dates induction #Labor: Progressing well. FB difficult placement. Attempted with speculum. Patient tolerated well. FB now in place. Redosing cytotec. Will evaluate in 4 hours or sooner if FB falls out.   #Pain: prn IV pain meds  #FWB: cat 1 #GBS negative  [redacted]w[redacted]d, MD 3:04 PM

## 2019-12-27 NOTE — Progress Notes (Signed)
Karen Rojas is a 24 y.o. G1P0000 at [redacted]w[redacted]d by LMP admitted for induction of labor due to Post dates. Due date 12/20/2019.  Subjective: Foley balloon out for 1.5 hours. Patient finished eating dinner.  Objective: BP 115/71    Pulse 68    Temp 98 F (36.7 C) (Oral)    Ht 5\' 2"  (1.575 m)    Wt 69.4 kg    LMP 02/05/2019 (Within Months)    BMI 27.98 kg/m  No intake/output data recorded. No intake/output data recorded.  FHT:  FHR: 145 bpm, variability: moderate,  accelerations:  Present,  decelerations:  Absent UC:   regular, every 1.5-3.5 minutes SVE:   Dilation: 5 Effacement (%): 70 Station: -3 Exam by:: [email or web address], CNM [facility]  Labs: Lab Results  Component Value Date   WBC 7.4 12/27/2019   HGB 10.5 (L) 12/27/2019   HCT 34.5 (L) 12/27/2019   MCV 80.0 12/27/2019   PLT 189 12/27/2019    Assessment / Plan: Induction of labor due to postterm,  progressing well on pitocin  Labor: Progressing normally Preeclampsia:  n/a Fetal Wellbeing:  Category I Pain Control:  Labor support without medications I/D:  n/a Anticipated MOD:  NSVD Will start Pitocin 2 x 2  Fentanyl 100 mcg q 1 hr prn pain Epidural per patient request  12/29/2019, CNM 12/27/2019, 7:59 PM

## 2019-12-27 NOTE — Progress Notes (Signed)
Informed pt of need for COVID admit test. Pt stated she did not want it. Explained that we would have to treat her as a postive pt which includes full PPE and support not being able to leave room. Also would not be allowed second vistor, Pt now agrees to test. Confirmed with pt one more time before swabbing her nose that she was giving me permission to proceed. Pts answer was yes.

## 2019-12-27 NOTE — H&P (Addendum)
OBSTETRIC ADMISSION HISTORY AND PHYSICAL  Karen Rojas is a 24 y.o. female G1P0000 with IUP at [redacted]w[redacted]d by LMP presenting for IOL for post dates. She reports +FMs, No LOF, no VB, no blurry vision, headaches or peripheral edema, and RUQ pain.  She plans on breast feeding. She denies anything for birth control. She received her prenatal care at  [facility]    Dating: By LMP--->  Estimated Date of Delivery: 12/20/19  Sono:   08/27/19 @[redacted]w[redacted]d , CWD, normal anatomy, variable presentation, 575g, 27% EFW  Prenatal History/Complications: None  Past Medical History: Past Medical History:  Diagnosis Date   Asthma    years since last used inhaler   Urinary tract bacterial infections     Past Surgical History: Past Surgical History:  Procedure Laterality Date   NO PAST SURGERIES      Obstetrical History: OB History     Gravida  1   Para  0   Term  0   Preterm  0   AB  0   Living  0      SAB  0   TAB  0   Ectopic  0   Multiple  0   Live Births              Social History Social History   Socioeconomic History   Marital status: Single    Spouse name: Not on file   Number of children: Not on file   Years of education: Not on file   Highest education level: High school graduate  Occupational History   Not on file  Tobacco Use   Smoking status: Former Smoker    Quit date: 2019    Years since quitting: 2.9   Smokeless tobacco: Never Used  2020 Use: Never used  Substance and Sexual Activity   Alcohol use: Not Currently    Alcohol/week: 0.0 standard drinks   Drug use: Not Currently    Types: Marijuana    Comment: occasional been a couple of years   Sexual activity: Yes    Partners: Male    Birth control/protection: None  Other Topics Concern   Not on file  Social History Narrative   Not on file   Social Determinants of Health   Financial Resource Strain:    Difficulty of Paying Living Expenses: Not on file  Food Insecurity:     Worried About Building services engineer in the Last Year: Not on file   Programme researcher, broadcasting/film/video of Food in the Last Year: Not on file  Transportation Needs:    Lack of Transportation (Medical): Not on file   Lack of Transportation (Non-Medical): Not on file  Physical Activity:    Days of Exercise per Week: Not on file   Minutes of Exercise per Session: Not on file  Stress:    Feeling of Stress : Not on file  Social Connections:    Frequency of Communication with Friends and Family: Not on file   Frequency of Social Gatherings with Friends and Family: Not on file   Attends Religious Services: Not on file   Active Member of Clubs or Organizations: Not on file   Attends [employer] Meetings: Not on file   Marital Status: Not on file    Family History: Family History  Problem Relation Age of Onset   Kidney disease Mother     Allergies: Allergies  Allergen Reactions   Sulfa Antibiotics Hives    Medications Prior to Admission  Medication Sig Dispense Refill Last Dose   ascorbic acid (VITAMIN C) 500 MG tablet Take 1 tablet (500 mg total) by mouth every other day. Take with iron pill (Patient not taking: Reported on 12/22/2019) 30 tablet 3    Blood Pressure Monitoring (BLOOD PRESSURE MONITOR AUTOMAT) DEVI 1 Device by Does not apply route daily. Automatic blood pressure cuff regular size. To monitor blood pressure regularly at home. ICD-10 code:Z34.90 1 each 0    Elastic Bandages & Supports (COMFORT FIT MATERNITY SUPP MED) MISC 1 Units by Does not apply route daily. (Patient not taking: Reported on 12/16/2019) 1 each 0    ferrous sulfate 325 (65 FE) MG EC tablet Take 1 tablet (325 mg total) by mouth every other day. (Patient not taking: Reported on 12/22/2019) 30 tablet 2    Misc. Devices (GOJJI WEIGHT SCALE) MISC 1 Device by Does not apply route daily as needed. To weight self daily as needed at home. ICD-10 code: Z34.90 1 each 0    ondansetron (ZOFRAN ODT) 4 MG disintegrating tablet Take 1 tablet  (4 mg total) by mouth every 8 (eight) hours as needed for nausea or vomiting. (Patient not taking: Reported on 06/18/2019) 20 tablet 0    Prenatal Vit-Fe Fumarate-FA (PRENATAL COMPLETE) 14-0.4 MG TABS Take 1 tablet by mouth at bedtime. (Patient not taking: Reported on 06/18/2019) 60 tablet 1    Prenatal Vit-Fe Phos-FA-Omega (VITAFOL GUMMIES) 3.33-0.333-34.8 MG CHEW Chew 3 each by mouth daily. 90 tablet 10    terconazole (TERAZOL 7) 0.4 % vaginal cream Place 1 applicator vaginally at bedtime. For 5 nights. (Patient not taking: Reported on 12/16/2019) 45 g 0    URINARY HEALTH/CRANBERRY PO Take 4 tablets by mouth daily. (Patient not taking: Reported on 07/30/2019)       Review of Systems   All systems reviewed and negative except as stated in HPI  Blood pressure 119/67, pulse 63, temperature 98.6 F (37 C), height 5\' 2"  (1.575 m), weight 69.4 kg, last menstrual period 02/05/2019. General appearance: alert and no distress Lungs: no dyspnea Heart: regular rate  Abdomen: soft, non-tender; gravid Pelvic: as stated below Extremities: Homans sign is negative, no sign of DVT Presentation: cephalic Fetal monitoringBaseline: 130 bpm, Variability: Good {> 6 bpm), Accelerations: Reactive and Decelerations: Absent Uterine activity: intermittent  Dilation: Fingertip Effacement (%): 50 Station: -2 Exam by:: J.Cox, RN  Prenatal labs: ABO, Rh: --/--/O POS (11/29 0830) Antibody: NEG (11/29 0830) Rubella: 3.97 (04/20 1531) RPR: NON REACTIVE (11/29 0816)  HBsAg: Negative (04/20 1531)  HIV: Non Reactive (09/02 1048)  GBS: Negative/-- (10/28 1551)  2 hr Glucola normal Genetic screening  negative Anatomy 07-11-1992 normal  Prenatal Transfer Tool  Maternal Diabetes: No Genetic Screening: Normal, mother is alpha thal carrier Maternal Ultrasounds/Referrals: Normal Fetal Ultrasounds or other Referrals:  None Maternal Substance Abuse:  No Significant Maternal Medications:  None Significant Maternal Lab Results:  None  Results for orders placed or performed during the hospital encounter of 12/27/19 (from the past 24 hour(s))  Resp Panel by RT-PCR (Flu A&B, Covid) Nasopharyngeal Swab   Collection Time: 12/27/19  7:57 AM   Specimen: Nasopharyngeal Swab; Nasopharyngeal(NP) swabs in vial transport medium  Result Value Ref Range   SARS Coronavirus 2 by RT PCR NEGATIVE NEGATIVE   Influenza A by PCR NEGATIVE NEGATIVE   Influenza B by PCR NEGATIVE NEGATIVE  CBC   Collection Time: 12/27/19  8:16 AM  Result Value Ref Range   WBC 7.4 4.0 - 10.5 K/uL  RBC 4.31 3.87 - 5.11 MIL/uL   Hemoglobin 10.5 (L) 12.0 - 15.0 g/dL   HCT 94.1 (L) 36 - 46 %   MCV 80.0 80.0 - 100.0 fL   MCH 24.4 (L) 26.0 - 34.0 pg   MCHC 30.4 30.0 - 36.0 g/dL   RDW 74.0 81.4 - 48.1 %   Platelets 189 150 - 400 K/uL   nRBC 0.0 0.0 - 0.2 %  RPR   Collection Time: 12/27/19  8:16 AM  Result Value Ref Range   RPR Ser Ql NON REACTIVE NON REACTIVE  Type and screen   Collection Time: 12/27/19  8:30 AM  Result Value Ref Range   ABO/RH(D) O POS    Antibody Screen NEG    Sample Expiration      12/30/2019,2359 Performed at [facility], [address] [place], [place] [address]     Patient Active Problem List   Diagnosis Date Noted   Post term pregnancy at [redacted] weeks gestation 12/27/2019   Anemia in pregnancy 10/01/2019   Screening for genetic disease carrier status 08/11/2019   Supervision of normal first pregnancy, antepartum 05/18/2019   Chlamydia 09/23/2014   Assessment/Plan:  Carisa Backhaus is a 24 y.o. G1P0000 at [redacted]w[redacted]d here for IOL 2/2 post dates.   #IOL: Patient is doing well. Based on SVE, will dose with cytotec. Consider FB at next cervical exam.   #Pain: prn IV pain meds #FWB: Cat 1 #ID: GBS negative #MOF: breast #MOC:none  Trula Slade, MD  12/27/2019, 11:12 AM    Attestation of Supervision of Student:  I confirm that I have verified the information documented in the  resident 's note and that I have  also personally reperformed the history, physical exam and all medical decision making activities.  I have verified that all services and findings are accurately documented in this student's note; and I agree with management and plan as outlined in the documentation. I have also made any necessary editorial changes. I was present for the entire encounter.  Raelyn Mora, CNM [facility] [employer], [facility] 12/27/2019 12:00 PM

## 2019-12-28 ENCOUNTER — Encounter (HOSPITAL_COMMUNITY): Payer: Self-pay | Admitting: Obstetrics and Gynecology

## 2019-12-28 DIAGNOSIS — O1403 Mild to moderate pre-eclampsia, third trimester: Secondary | ICD-10-CM | POA: Diagnosis not present

## 2019-12-28 DIAGNOSIS — O480 Post-term pregnancy: Secondary | ICD-10-CM

## 2019-12-28 DIAGNOSIS — O4202 Full-term premature rupture of membranes, onset of labor within 24 hours of rupture: Secondary | ICD-10-CM

## 2019-12-28 DIAGNOSIS — O411290 Chorioamnionitis, unspecified trimester, not applicable or unspecified: Secondary | ICD-10-CM | POA: Diagnosis not present

## 2019-12-28 DIAGNOSIS — Z3A41 41 weeks gestation of pregnancy: Secondary | ICD-10-CM

## 2019-12-28 LAB — CBC
HCT: 31 % — ABNORMAL LOW (ref 36.0–46.0)
Hemoglobin: 9.7 g/dL — ABNORMAL LOW (ref 12.0–15.0)
MCH: 24.2 pg — ABNORMAL LOW (ref 26.0–34.0)
MCHC: 31.3 g/dL (ref 30.0–36.0)
MCV: 77.3 fL — ABNORMAL LOW (ref 80.0–100.0)
Platelets: 170 10*3/uL (ref 150–400)
RBC: 4.01 MIL/uL (ref 3.87–5.11)
RDW: 13.5 % (ref 11.5–15.5)
WBC: 15 10*3/uL — ABNORMAL HIGH (ref 4.0–10.5)
nRBC: 0 % (ref 0.0–0.2)

## 2019-12-28 LAB — COMPREHENSIVE METABOLIC PANEL
ALT: 13 U/L (ref 0–44)
AST: 25 U/L (ref 15–41)
Albumin: 2.5 g/dL — ABNORMAL LOW (ref 3.5–5.0)
Alkaline Phosphatase: 167 U/L — ABNORMAL HIGH (ref 38–126)
Anion gap: 9 (ref 5–15)
BUN: 10 mg/dL (ref 6–20)
CO2: 20 mmol/L — ABNORMAL LOW (ref 22–32)
Calcium: 8.6 mg/dL — ABNORMAL LOW (ref 8.9–10.3)
Chloride: 104 mmol/L (ref 98–111)
Creatinine, Ser: 1.06 mg/dL — ABNORMAL HIGH (ref 0.44–1.00)
GFR, Estimated: >60 mL/min (ref >60)
Glucose, Bld: 92 mg/dL (ref 70–99)
Potassium: 3.8 mmol/L (ref 3.5–5.1)
Sodium: 133 mmol/L — ABNORMAL LOW (ref 135–145)
Total Bilirubin: 0.6 mg/dL (ref 0.3–1.2)
Total Protein: 5.8 g/dL — ABNORMAL LOW (ref 6.5–8.1)

## 2019-12-28 MED ORDER — ONDANSETRON HCL 4 MG PO TABS: 4.0000 mg | ORAL_TABLET | ORAL | Status: DC | PRN

## 2019-12-28 MED ORDER — ONDANSETRON HCL 4 MG/2ML IJ SOLN: 4.0000 mg | INTRAMUSCULAR | Status: DC | PRN

## 2019-12-28 MED ORDER — GENTAMICIN SULFATE 40 MG/ML IJ SOLN
5.0000 mg/kg | INTRAVENOUS | Status: DC
  Administered 2019-12-28: 290 mg via INTRAVENOUS
  Filled 2019-12-28: qty 7.25

## 2019-12-28 MED ORDER — COCONUT OIL OIL: 1.0000 "application " | TOPICAL_OIL | Status: DC | PRN

## 2019-12-28 MED ORDER — SENNOSIDES-DOCUSATE SODIUM 8.6-50 MG PO TABS
2.0000 | ORAL_TABLET | ORAL | Status: DC
  Administered 2019-12-29 – 2019-12-30 (×2): 2 via ORAL
  Filled 2019-12-28 (×2): qty 2

## 2019-12-28 MED ORDER — SODIUM CHLORIDE 0.9 % IV SOLN
2.0000 g | Freq: Four times a day (QID) | INTRAVENOUS | Status: DC
  Administered 2019-12-28: 2 g via INTRAVENOUS
  Filled 2019-12-28: qty 2000

## 2019-12-28 MED ORDER — TETANUS-DIPHTH-ACELL PERTUSSIS 5-2.5-18.5 LF-MCG/0.5 IM SUSY: 0.5000 mL | PREFILLED_SYRINGE | Freq: Once | INTRAMUSCULAR | Status: DC

## 2019-12-28 MED ORDER — ZOLPIDEM TARTRATE 5 MG PO TABS: 5.0000 mg | ORAL_TABLET | Freq: Every evening | ORAL | Status: DC | PRN

## 2019-12-28 MED ORDER — SIMETHICONE 80 MG PO CHEW: 80.0000 mg | CHEWABLE_TABLET | ORAL | Status: DC | PRN

## 2019-12-28 MED ORDER — IBUPROFEN 600 MG PO TABS
600.0000 mg | ORAL_TABLET | Freq: Four times a day (QID) | ORAL | Status: DC
  Administered 2019-12-28 – 2019-12-30 (×7): 600 mg via ORAL
  Filled 2019-12-28 (×7): qty 1

## 2019-12-28 MED ORDER — ACETAMINOPHEN 500 MG PO TABS
1000.0000 mg | ORAL_TABLET | Freq: Four times a day (QID) | ORAL | Status: DC | PRN
  Administered 2019-12-28: 1000 mg via ORAL
  Filled 2019-12-28: qty 2

## 2019-12-28 MED ORDER — BENZOCAINE-MENTHOL 20-0.5 % EX AERO
1.0000 "application " | INHALATION_SPRAY | CUTANEOUS | Status: DC | PRN
  Filled 2019-12-28: qty 56

## 2019-12-28 MED ORDER — WITCH HAZEL-GLYCERIN EX PADS: 1.0000 "application " | MEDICATED_PAD | CUTANEOUS | Status: DC | PRN

## 2019-12-28 MED ORDER — OXYCODONE HCL 5 MG PO TABS: 5.0000 mg | ORAL_TABLET | ORAL | Status: DC | PRN

## 2019-12-28 MED ORDER — DIPHENHYDRAMINE HCL 25 MG PO CAPS: 25.0000 mg | ORAL_CAPSULE | Freq: Four times a day (QID) | ORAL | Status: DC | PRN

## 2019-12-28 MED ORDER — PRENATAL MULTIVITAMIN CH
1.0000 | ORAL_TABLET | Freq: Every day | ORAL | Status: DC
  Administered 2019-12-29 – 2019-12-30 (×2): 1 via ORAL
  Filled 2019-12-28 (×2): qty 1

## 2019-12-28 MED ORDER — LIDOCAINE HCL (PF) 1 % IJ SOLN
INTRAMUSCULAR | Status: DC | PRN
  Administered 2019-12-27: 10 mL via EPIDURAL
  Administered 2019-12-27: 2 mL via EPIDURAL

## 2019-12-28 MED ORDER — ACETAMINOPHEN 325 MG PO TABS
650.0000 mg | ORAL_TABLET | ORAL | Status: DC | PRN
  Filled 2019-12-28: qty 2

## 2019-12-28 MED ORDER — MEASLES, MUMPS & RUBELLA VAC IJ SOLR: 0.5000 mL | Freq: Once | INTRAMUSCULAR | Status: DC

## 2019-12-28 MED ORDER — SODIUM CHLORIDE (PF) 0.9 % IJ SOLN
INTRAMUSCULAR | Status: DC | PRN
  Administered 2019-12-27: 12 mL/h via EPIDURAL

## 2019-12-28 MED ORDER — DIBUCAINE (PERIANAL) 1 % EX OINT
1.0000 "application " | TOPICAL_OINTMENT | CUTANEOUS | Status: DC | PRN
  Filled 2019-12-28: qty 28

## 2019-12-28 NOTE — Progress Notes (Addendum)
PT placed supine for foley placement, FHR down to 70's 80's X ~ 10 minutes.  Position changes, pit off, IVF bolus, and finally terb given.  Dr. Vergie Living in room, FHR returned to baseline w/pt in high folwers. Cx 5.5/70/-2 (was -3).  Ctx still q 2 minutes, will not restart pitocin unless spaces out.  Check cx in a few hours to assess for change  Urine started clear yelllow after foley placement, suddenly became bright red. ? Trauma w/fetal head descent?  Continue to monitor

## 2019-12-28 NOTE — Discharge Instructions (Signed)

## 2019-12-28 NOTE — Discharge Summary (Signed)
Postpartum Discharge Summary  Date of Service updated 12/29/19     Patient Name: Karen Rojas DOB: 08/31/1995 MRN: [id number]  Date of admission: 12/27/2019 Delivery date:12/28/2019  Delivering provider: Gerlene Fee  Date of discharge: 12/29/2019  Admitting diagnosis: Post term pregnancy at [redacted] weeks gestation [O48.0, Z3A.41] Intrauterine pregnancy: [redacted]w[redacted]d     Secondary diagnosis:  Active Problems:   Screening for genetic disease carrier status   Anemia in pregnancy   Post term pregnancy at [redacted] weeks gestation   Chorioamnionitis   Mild preeclampsia, third trimester   Vaginal delivery  Additional problems: none    Discharge diagnosis: Term Pregnancy Delivered and Preeclampsia (mild)                                              Post partum procedures:none Augmentation: Pitocin, Cytotec and IP Foley Complications: Intrauterine Inflammation or infection (Chorioamniotis)  Hospital course: Induction of Labor With Vaginal Delivery   24 y.o. yo G1P0000 at [redacted]w[redacted]d was admitted to the hospital 12/27/2019 for induction of labor.  Indication for induction: Postdates.  Patient had a labor course remarkable for having some mild range elevated pressures during labor, asymptomatic. Pre-e labs were collected prior to delivery showing creatine of 1.06, which is more than doubling of her value in the first tri (April 2021) of 0.6, giving a dx of mild pre-e. She also had a temp of 100.4 approx 4hrs prior to delivery and received a dose each of Amp and Gent.  Membrane Rupture Time/Date: 11:47 PM ,12/27/2019   Delivery Method:Vaginal, Spontaneous  Episiotomy: None  Lacerations:  1st degree;Perineal;Periurethral  Details of delivery can be found in separate delivery note.  Patient had a routine postpartum course. Patient is discharged home 12/29/19.  Newborn Data: Birth date:12/28/2019  Birth time:10:16 AM  Gender:Female  Living status:Living  Apgars:8 ,9  Weight:3335 g (7lb  5.6oz)  Magnesium Sulfate received: No BMZ received: No Rhophylac:N/A MMR:N/A T-DaP:declined prenatally Flu: No Transfusion:No  Physical exam  Vitals:   12/28/19 1230 12/28/19 1334 12/28/19 1739 12/28/19 2229  BP: 119/85 120/88 127/88 118/78  Pulse: 72 60 64 82  Resp: $Remo'18 18 20 18  'tMzfq$ Temp: 98.2 F (36.8 C) 98.3 F (36.8 C) 98.5 F (36.9 C)   TempSrc: Oral Axillary Oral   SpO2: 99% 98% 99%   Weight:      Height:       General: alert, cooperative and no distress Lochia: appropriate Uterine Fundus: firm Incision: N/A DVT Evaluation: No evidence of DVT seen on physical exam. Labs: Lab Results  Component Value Date   WBC 15.0 (H) 12/28/2019   HGB 9.7 (L) 12/28/2019   HCT 31.0 (L) 12/28/2019   MCV 77.3 (L) 12/28/2019   PLT 170 12/28/2019   CMP Latest Ref Rng & Units 12/28/2019  Glucose 70 - 99 mg/dL 92  BUN 6 - 20 mg/dL 10  Creatinine 0.44 - 1.00 mg/dL 1.06(H)  Sodium 135 - 145 mmol/L 133(L)  Potassium 3.5 - 5.1 mmol/L 3.8  Chloride 98 - 111 mmol/L 104  CO2 22 - 32 mmol/L 20(L)  Calcium 8.9 - 10.3 mg/dL 8.6(L)  Total Protein 6.5 - 8.1 g/dL 5.8(L)  Total Bilirubin 0.3 - 1.2 mg/dL 0.6  Alkaline Phos 38 - 126 U/L 167(H)  AST 15 - 41 U/L 25  ALT 0 - 44 U/L 13   Edinburgh Score: [place]  Postnatal Depression Scale Screening Tool 12/28/2019  I have been able to laugh and see the funny side of things. 0  I have looked forward with enjoyment to things. 0  I have blamed myself unnecessarily when things went wrong. 1  I have been anxious or worried for no good reason. 0  I have felt scared or panicky for no good reason. 0  Things have been getting on top of me. 0  I have been so unhappy that I have had difficulty sleeping. 0  I have felt sad or miserable. 0  I have been so unhappy that I have been crying. 0  The thought of harming myself has occurred to me. 0  Edinburgh Postnatal Depression Scale Total 1     After visit meds:  Allergies as of 12/29/2019       Reactions   Sulfa Antibiotics Hives      Medication List    TAKE these medications   ascorbic acid 500 MG tablet Commonly known as: VITAMIN C Take 1 tablet (500 mg total) by mouth every other day. Take with iron pill   Blood Pressure Monitor Automat Devi 1 Device by Does not apply route daily. Automatic blood pressure cuff regular size. To monitor blood pressure regularly at home. ICD-10 code:Z34.90   [place] 1 Units by Does not apply route daily.   ferrous sulfate 325 (65 FE) MG EC tablet Take 1 tablet (325 mg total) by mouth every other day.   [place] Weight Scale Misc 1 Device by Does not apply route daily as needed. To weight self daily as needed at home. ICD-10 code: Z34.90   ibuprofen 600 MG tablet Commonly known as: ADVIL Take 1 tablet (600 mg total) by mouth every 6 (six) hours.   ondansetron 4 MG disintegrating tablet Commonly known as: Zofran ODT Take 1 tablet (4 mg total) by mouth every 8 (eight) hours as needed for nausea or vomiting.   Prenatal Complete 14-0.4 MG Tabs Take 1 tablet by mouth at bedtime.   terconazole 0.4 % vaginal cream Commonly known as: TERAZOL 7 Place 1 applicator vaginally at bedtime. For 5 nights.   URINARY HEALTH/CRANBERRY PO Take 4 tablets by mouth daily.   Vitafol Gummies 3.33-0.333-34.8 MG Chew Chew 3 each by mouth daily.        Discharge home in stable condition Infant Feeding: Breast Infant Disposition:home with mother Discharge instruction: per After Visit Summary and Postpartum booklet. Activity: Advance as tolerated. Pelvic rest for 6 weeks.  Diet: routine diet Future Appointments:No future appointments. Follow up Visit:  Follow-up Information    [place]. Schedule an appointment as soon as possible for a visit in 1 week(s).   Specialty: Obstetrics and Gynecology Contact information: [place] [address] [phone]               Myrtis Ser, CNM  P [facility] Please schedule this patient for Postpartum visit in: 1 week for BP check, then 4 weeks with the following provider: Any provider  BP check can be an RN visit  For C/S patients schedule nurse incision check in weeks 2 weeks: no  High risk pregnancy complicated by: gHTN  Delivery mode: SVD  Anticipated Birth Control: other/unsure  PP Procedures needed: BP check  Schedule Integrated [place] visit: no   12/29/2019 Hansel Feinstein, CNM

## 2019-12-28 NOTE — Progress Notes (Signed)
Patient Vitals for the past 4 hrs:  BP Temp Temp src Pulse Resp SpO2  12/28/19 0430 (!) 135/92 99.8 F (37.7 C) Oral (!) 127 16 --  12/28/19 0330 (!) 131/91 -- -- 99 18 --  12/28/19 0300 128/86 -- -- (!) 106 -- --  12/28/19 0230 130/86 -- -- (!) 118 18 100 %  12/28/19 0200 130/84 -- -- (!) 119 -- 100 %  12/28/19 0150 -- -- -- -- -- 100 %  12/28/19 0145 -- -- -- -- -- 100 %  12/28/19 0140 -- -- -- -- -- 100 %  12/28/19 0135 -- -- -- -- -- 100 %   Ctx q 2-4 minutes, FHR mostly Cat 1 w/occ ? Late decel.  Mod variability. cx 6/80/-2. IUPC placed.  Will restart pitocin if labor inadequate and baby tolerates it. Now meets criteria for GHTN. Will closely monitor BP.  Urine output fine, urine now orange.

## 2019-12-28 NOTE — Progress Notes (Signed)
Vitals:   12/28/19 0530 12/28/19 0550  BP: 134/87   Pulse: (!) 101   Resp:    Temp:  (!) 100.4 F (38 C)  SpO2:     FHR 170's, mod variability w/some late decels. MVUs inadequate.  Dr. Vergie Living notified.  Recommended reassessing FHT after 76md abx (amp/gent) in, hopeful for improvement enough to restart pitocin.

## 2019-12-28 NOTE — Anesthesia Postprocedure Evaluation (Signed)
Anesthesia Post Note  Patient: Karen Rojas  Procedure(s) Performed: AN AD HOC LABOR EPIDURAL     Patient location during evaluation: Mother Baby Anesthesia Type: Epidural Level of consciousness: awake and alert and oriented Pain management: satisfactory to patient Vital Signs Assessment: post-procedure vital signs reviewed and stable Respiratory status: spontaneous breathing and nonlabored ventilation Cardiovascular status: stable Postop Assessment: no headache, no backache, no signs of nausea or vomiting, adequate PO intake and patient able to bend at knees (patient up walking) Anesthetic complications: no   No complications documented.  Last Vitals:  Vitals:   12/28/19 1230 12/28/19 1334  BP: 119/85 120/88  Pulse: 72 60  Resp: 18 18  Temp: 36.8 C 36.8 C  SpO2: 99% 98%    Last Pain:  Vitals:   12/28/19 1334  TempSrc: Axillary  PainSc:    Pain Goal: Patients Stated Pain Goal: 0 (12/28/19 1230)                 Madison Hickman

## 2019-12-28 NOTE — Anesthesia Procedure Notes (Addendum)
Epidural Patient location during procedure: OB Start time: 12/27/2019 10:57 PM End time: 12/27/2019 11:06 PM  Staffing Anesthesiologist: Lannie Fields, DO Performed: anesthesiologist   Preanesthetic Checklist Completed: patient identified, IV checked, risks and benefits discussed, monitors and equipment checked, pre-op evaluation and timeout performed  Epidural Patient position: sitting Prep: DuraPrep and site prepped and draped Patient monitoring: continuous pulse ox, blood pressure, heart rate and cardiac monitor Approach: midline Location: L3-L4 Injection technique: LOR air  Needle:  Needle type: Tuohy  Needle gauge: 17 G Needle length: 9 cm Needle insertion depth: 5 cm Catheter type: closed end flexible Catheter size: 19 Gauge Catheter at skin depth: 10 cm Test dose: negative  Assessment Sensory level: T8 Events: blood not aspirated, injection not painful, no injection resistance, no paresthesia and negative IV test  Additional Notes Patient identified. Risks/Benefits/Options discussed with patient including but not limited to bleeding, infection, nerve damage, paralysis, failed block, incomplete pain control, headache, blood pressure changes, nausea, vomiting, reactions to medication both or allergic, itching and postpartum back pain. Confirmed with bedside nurse the patient's most recent platelet count. Confirmed with patient that they are not currently taking any anticoagulation, have any bleeding history or any family history of bleeding disorders. Patient expressed understanding and wished to proceed. All questions were answered. Sterile technique was used throughout the entire procedure. Please see nursing notes for vital signs. Test dose was given through epidural catheter and negative prior to continuing to dose epidural or start infusion. Warning signs of high block given to the patient including shortness of breath, tingling/numbness in hands, complete motor  block, or any concerning symptoms with instructions to call for help. Patient was given instructions on fall risk and not to get out of bed. All questions and concerns addressed with instructions to call with any issues or inadequate analgesia.  Reason for block:procedure for pain

## 2019-12-28 NOTE — Anesthesia Preprocedure Evaluation (Signed)
Anesthesia Evaluation  Patient identified by MRN, date of birth, ID band Patient awake    Reviewed: Allergy & Precautions, Patient's Chart, lab work & pertinent test results  Airway Mallampati: II  TM Distance: >3 FB Neck ROM: Full    Dental no notable dental hx.    Pulmonary asthma , former smoker,    Pulmonary exam normal breath sounds clear to auscultation       Cardiovascular negative cardio ROS Normal cardiovascular exam Rhythm:Regular Rate:Normal     Neuro/Psych negative neurological ROS  negative psych ROS   GI/Hepatic negative GI ROS, Neg liver ROS,   Endo/Other  negative endocrine ROS  Renal/GU negative Renal ROS  negative genitourinary   Musculoskeletal negative musculoskeletal ROS (+)   Abdominal   Peds negative pediatric ROS (+)  Hematology  (+) Blood dyscrasia, anemia , hct 34.5, plt 189   Anesthesia Other Findings   Reproductive/Obstetrics (+) Pregnancy                             Anesthesia Physical Anesthesia Plan  ASA: II and emergent  Anesthesia Plan: Epidural   Post-op Pain Management:    Induction:   PONV Risk Score and Plan: 2  Airway Management Planned: Natural Airway  Additional Equipment: None  Intra-op Plan:   Post-operative Plan:   Informed Consent: I have reviewed the patients History and Physical, chart, labs and discussed the procedure including the risks, benefits and alternatives for the proposed anesthesia with the patient or authorized representative who has indicated his/her understanding and acceptance.       Plan Discussed with:   Anesthesia Plan Comments:         Anesthesia Quick Evaluation

## 2019-12-28 NOTE — Progress Notes (Signed)
Patient Vitals for the past 4 hrs:  BP Temp Temp src Pulse Resp SpO2  12/28/19 0000 131/87 -- -- 81 16 --  12/27/19 2345 -- 97.9 F (36.6 C) Oral -- -- --  12/27/19 2330 (!) 141/77 -- -- 75 -- 99 %  12/27/19 2325 134/84 -- -- 70 -- 100 %  12/27/19 2320 125/80 -- -- 83 18 100 %  12/27/19 2317 131/82 -- -- 72 -- --  12/27/19 2315 -- -- -- -- -- 100 %  12/27/19 2310 122/84 -- -- 69 16 100 %  12/27/19 2304 130/84 -- -- 84 16 --  12/27/19 2259 135/82 -- -- 71 -- 100 %  12/27/19 2254 -- -- -- -- -- 100 %  12/27/19 2250 -- -- -- -- -- 100 %  12/27/19 2245 -- -- -- -- -- 100 %  12/27/19 2200 115/74 98.2 F (36.8 C) Oral 67 18 --  12/27/19 2145 -- -- -- -- -- 98 %  12/27/19 2140 -- -- -- -- -- 98 %  12/27/19 2135 -- -- -- -- -- 97 %  12/27/19 2040 -- -- -- -- -- 97 %  12/27/19 2035 -- -- -- -- -- 100 %   Foley came out around 8pm, cx 5/70/-3.  Ctx are q 1-2 minutes, getting stronger per pt.  She had 3 doses of IV meds before asking for her epidural.  She is now comfortable, had SROM abou 30 minutes ago.  Her ctx are still 1-3 minutes apart, but her cx has not changed.  Will use pitocin to augment labor.  FHR Cat 1.

## 2019-12-28 NOTE — Progress Notes (Signed)
ANTIBIOTIC CONSULT NOTE - INITIAL  Pharmacy Consult for Gentamicin Indication: Chorioamnionitis   Allergies  Allergen Reactions  . Sulfa Antibiotics Hives    Patient Measurements: Height: 5\' 2"  (157.5 cm) Weight: 69.4 kg (153 lb) IBW/kg (Calculated) : 50.1 Adjusted Body Weight: 57.8 kg  Vital Signs: Temp: 100.4 F (38 C) (11/30 0550) Temp Source: Oral (11/30 0550) BP: 132/85 (11/30 0600) Pulse Rate: 97 (11/30 0600)  Labs: Recent Labs    12/27/19 0816  WBC 7.4  HGB 10.5*  PLT 189   No results for input(s): GENTTROUGH, GENTPEAK, GENTRANDOM in the last 72 hours.   Microbiology: Recent Results (from the past 720 hour(s))  Resp Panel by RT-PCR (Flu A&B, Covid) Nasopharyngeal Swab     Status: None   Collection Time: 12/27/19  7:57 AM   Specimen: Nasopharyngeal Swab; Nasopharyngeal(NP) swabs in vial transport medium  Result Value Ref Range Status   SARS Coronavirus 2 by RT PCR NEGATIVE NEGATIVE Final    Comment: (NOTE) SARS-CoV-2 target nucleic acids are NOT DETECTED.  The SARS-CoV-2 RNA is generally detectable in upper respiratory specimens during the acute phase of infection. The lowest concentration of SARS-CoV-2 viral copies this assay can detect is 138 copies/mL. A negative result does not preclude SARS-Cov-2 infection and should not be used as the sole basis for treatment or other patient management decisions. A negative result may occur with  improper specimen collection/handling, submission of specimen other than nasopharyngeal swab, presence of viral mutation(s) within the areas targeted by this assay, and inadequate number of viral copies(<138 copies/mL). A negative result must be combined with clinical observations, patient history, and epidemiological information. The expected result is Negative.  Fact Sheet for Patients:  12/29/19  Fact Sheet for Healthcare Providers:  BloggerCourse.com  This  test is no t yet approved or cleared by the [employer] FDA and  has been authorized for detection and/or diagnosis of SARS-CoV-2 by FDA under an Emergency Use Authorization (EUA). This EUA will remain  in effect (meaning this test can be used) for the duration of the COVID-19 declaration under Section 564(b)(1) of the Act, 21 U.S.C.section 360bbb-3(b)(1), unless the authorization is terminated  or revoked sooner.       Influenza A by PCR NEGATIVE NEGATIVE Final   Influenza B by PCR NEGATIVE NEGATIVE Final    Comment: (NOTE) The Xpert Xpress SARS-CoV-2/FLU/RSV plus assay is intended as an aid in the diagnosis of influenza from Nasopharyngeal swab specimens and should not be used as a sole basis for treatment. Nasal washings and aspirates are unacceptable for Xpert Xpress SARS-CoV-2/FLU/RSV testing.  Fact Sheet for Patients: [place]  Fact Sheet for Healthcare Providers: BloggerCourse.com  This test is not yet approved or cleared by the [employer].[place] FDA and has been authorized for detection and/or diagnosis of SARS-CoV-2 by FDA under an Emergency Use Authorization (EUA). This EUA will remain in effect (meaning this test can be used) for the duration of the COVID-19 declaration under Section 564(b)(1) of the Act, 21 U.S.C. section 360bbb-3(b)(1), unless the authorization is terminated or revoked.  Performed at [facility], [address] [place], [place]     Medications:  Ampicillin 2 grams q6  Assessment: 24 y.o. female G1P0000 at [redacted]w[redacted]d with presumed chorioamnionitis.    Plan:  Gentamicin 5mg /kg Q24 hours  Check Scr with next labs if gentamicin continued. Will check gentamicin levels if continued > 72hr or clinically indicated.  [redacted]w[redacted]d, Karen Rojas 12/28/2019,6:04 AM

## 2019-12-28 NOTE — Lactation Note (Addendum)
This note was copied from a baby's chart. Lactation Consultation Note  Patient Name: Karen Rojas [id number] Date: 12/28/2019  Infant is 41 weeks 7 hours old. Mom latched twice one for 10 minutes and second for 5 minutes at 10 am and 4 pm.   LC examined Mom's breast which are soft and compressible. Some edema noted, LC showed Mom how to push back around the areola and rotate the nipple prior to latching. Nipple more prominent and infant able to latch with signs of milk transfer with stimulation.   LC expressed 2.5 ml of breast milk and fed via spoon with Dad prior to latching at the breast.   Mom has a electric pump at home unsure of the name. Mom's plan to EBF. She has WIC and lives in [place].   Plan 1. To feed based on cues 8-12x in 24 hour period no more than 4 hours without an attempt. Mom to look for signs of milk transfer with feed and offer both breasts.           2. Mom taught to hand express and LC worked with Dad to show how to spoon feed. Parents can do hand expression for increase stimulation and spoon feed.           3. I's and O's sheet reviewed.           4. LC brochure of inpatient and outpatient services reviewed.    LC called to help with latch. Mom latched on the left side for 15 minutes. Mom stated wanted to try a new position since her arm felt awkward in football. LC assisted Mom to go into cross cradle on the right. Mom had some soreness and even with a deep latch she began to cry.  Some redness on the right side but no signs of abrasions. LC went ahead and gave comfort gels with instructions to discard after 6 days and not to use with coconut oil.   LC also gave breast shells to help with areola edema to bring her nipples out more before latching.

## 2019-12-29 MED ORDER — IBUPROFEN 600 MG PO TABS: 600.0000 mg | ORAL_TABLET | Freq: Four times a day (QID) | ORAL | 0 refills | Status: DC

## 2019-12-29 NOTE — Lactation Note (Signed)
This note was copied from a baby's chart. Lactation Consultation Note Baby 13 hrs old. Mom having pain in nipples. Tech stated mom reported nipples bleeding. LC assessed nipples. Noted short shaft nipples red. Very compressible. Clear colostrum noted when hand expressed.  Discussed feeding positions. Assisted in football hold. discussed body alignment, support, props, safety, breast massage STS, I&O, supply and demand. Newborn feeding habits and behavior discussed.  Latched baby. Told mom it would probably hurt at first d/t redness but after 4-5 sucks it should get better if not the baby isn't latched right. Mom stated much better. Taught flanging lips and cheeks to breast.   Baby fed well. When baby finished noted red inflamed duct and blister. Mom stated it's bleeding. LC touched nipple showing mom it's just red, it's not bleeding. Hand expressed clear colostrum. No blood. Placed comfort gels on nipples. Swaddled baby. Encouraged mom to rest.  Encouraged to call if needs assistance.  LC feels that mom may need NS. Mom will wear shells in am.  Patient Name: Karen Rojas MEQAS'T Date: 12/29/2019 Reason for consult: Mother's request;Difficult latch;Primapara;Nipple pain/trauma;Term   Maternal Data Has patient been taught Hand Expression?: Yes Does the patient have breastfeeding experience prior to this delivery?: No  Feeding Feeding Type: Breast Fed  LATCH Score Latch: Grasps breast easily, tongue down, lips flanged, rhythmical sucking.  Audible Swallowing: A few with stimulation  Type of Nipple: Everted at rest and after stimulation (very short compressible shaft)  Comfort (Breast/Nipple): Filling, red/small blisters or bruises, mild/mod discomfort (blister and pain)  Hold (Positioning): Full assist, staff holds infant at breast  LATCH Score: 6  Interventions Interventions: Breast feeding basics reviewed;Adjust position;Assisted with latch;Support pillows;Skin to  skin;Position options;Breast massage;Hand express;Shells;Breast compression;Hand pump  Lactation Tools Discussed/Used Tools: Shells;Comfort gels Shell Type: Inverted   Consult Status Consult Status: Follow-up Date: 12/29/19 Follow-up type: In-patient    Charyl Dancer 12/29/2019, 12:13 AM

## 2019-12-29 NOTE — Lactation Note (Signed)
This note was copied from a baby's chart. Lactation Consultation Note  Patient Name: Karen Rojas [id number] Date: 12/29/2019   [name] Karen Rojas now 68 hours old.  Mom and dad resting on arrival.  Baby sleeping. [name] with 3 percent weight loss and adequate voids and stools.  Mom reports her nipples are feeling much better now.  Mom using hand expression and coconut oil and comfort gels.  Mom Reports she knows to use the coconut oil and comfort gels separately.   Parents deny need at this time for lactation services. Praised breastfeeding.sed to call lactation as needed.      [name] 12/29/2019, 10:35 PM

## 2019-12-30 LAB — SURGICAL PATHOLOGY

## 2019-12-30 NOTE — Progress Notes (Addendum)
Post Partum Day 2 Subjective: Karen Rojas  is a 24 y.o. G1P1001 s/p SVD at [redacted]w[redacted]d.  She reports she is doing well. No acute events overnight. She denies any problems with ambulating, voiding or po intake. Denies nausea or vomiting.  Pain is well controlled on ibuprofen.  Lochia is moderate and improving.  Objective: Blood pressure 110/61, pulse (!) 58, temperature 98.2 F (36.8 C), temperature source Oral, resp. rate 18, height 5\' 2"  (1.575 m), weight 69.4 kg, last menstrual period 02/05/2019, SpO2 99 %, unknown if currently breastfeeding.  Physical Exam:  General: alert, cooperative, appears stated age and no distress Lochia: appropriate Uterine Fundus: firm Incision: n/a Lacerations: 1st degree perineal, periurethral, healing well DVT Evaluation: No evidence of DVT seen on physical exam. No significant calf/ankle edema.  Recent Labs    12/27/19 0816 12/28/19 0840  HGB 10.5* 9.7*  HCT 34.5* 31.0*    Assessment/Plan: Discharge home, Breastfeeding and Contraception none   LOS: 3 days   12/30/19 12/30/2019, 7:36 AM

## 2019-12-30 NOTE — Discharge Summary (Signed)
Postpartum Discharge Summary                            Patient Name: Karen Rojas DOB: September 21, 1995 MRN: [id number]  Date of admission: 12/27/2019 Delivery date:12/28/2019  Delivering provider: Gerlene Fee  Date of discharge: 12/30/2019  Admitting diagnosis: Post term pregnancy at [redacted] weeks gestation [O48.0, Z3A.41] Intrauterine pregnancy: [redacted]w[redacted]d     Secondary diagnosis:  Active Problems:   Screening for genetic disease carrier status   Anemia in pregnancy   Post term pregnancy at [redacted] weeks gestation   Chorioamnionitis   Mild preeclampsia, third trimester   Vaginal delivery  Additional problems: none                            Discharge diagnosis: Term Pregnancy Delivered and Preeclampsia (mild)                                              Post partum procedures:none Augmentation: Pitocin, Cytotec and IP Foley Complications: Intrauterine Inflammation or infection (Chorioamniotis)  Hospital course: Induction of Labor With Vaginal Delivery   24 y.o. yo G1P0000 at [redacted]w[redacted]d was admitted to the hospital 12/27/2019 for induction of labor.  Indication for induction: Postdates.  Patient had a labor course remarkable for having some mild range elevated pressures during labor, asymptomatic. Pre-e labs were collected prior to delivery showing creatine of 1.06, which is more than doubling of her value in the first tri (April 2021) of 0.6, giving a dx of mild pre-e. She also had a temp of 100.4 approx 4hrs prior to delivery and received a dose each of Amp and Gent.  Membrane Rupture Time/Date: 11:47 PM ,12/27/2019   Delivery Method:Vaginal, Spontaneous  Episiotomy: None  Lacerations:  1st degree;Perineal;Periurethral  Details of delivery can be found in separate delivery note.  Patient had a routine postpartum course. Patient is discharged home 12/30/19- she had initially wanted to leave on 12/1, but then began with some cramping and decided to stay another day and is feeling better  now.  Newborn Data: Birth date:12/28/2019  Birth time:10:16 AM  Gender:Female  Living status:Living  Apgars:8 ,9  Weight:3335 g (7lb 5.6oz)  Magnesium Sulfate received: No BMZ received: No Rhophylac:N/A MMR:N/A T-DaP:declined prenatally Flu: No Transfusion:No  Physical exam        Vitals:   12/28/19 1230 12/28/19 1334 12/28/19 1739 12/28/19 2229  BP: 119/85 120/88 127/88 118/78  Pulse: 72 60 64 82  Resp: $Remo'18 18 20 18  'FfQJC$ Temp: 98.2 F (36.8 C) 98.3 F (36.8 C) 98.5 F (36.9 C)   TempSrc: Oral Axillary Oral   SpO2: 99% 98% 99%   Weight:      Height:       General: alert, cooperative and no distress Lochia: appropriate Uterine Fundus: firm Incision: N/A DVT Evaluation: No evidence of DVT seen on physical exam. Labs: Recent Labs       Lab Results  Component Value Date   WBC 15.0 (H) 12/28/2019   HGB 9.7 (L) 12/28/2019   HCT 31.0 (L) 12/28/2019   MCV 77.3 (L) 12/28/2019   PLT 170 12/28/2019     CMP Latest Ref Rng & Units 12/28/2019  Glucose 70 - 99 mg/dL 92  BUN 6 - 20 mg/dL 10  Creatinine 0.44 - 1.00 mg/dL  1.06(H)  Sodium 135 - 145 mmol/L 133(L)  Potassium 3.5 - 5.1 mmol/L 3.8  Chloride 98 - 111 mmol/L 104  CO2 22 - 32 mmol/L 20(L)  Calcium 8.9 - 10.3 mg/dL 8.6(L)  Total Protein 6.5 - 8.1 g/dL 5.8(L)  Total Bilirubin 0.3 - 1.2 mg/dL 0.6  Alkaline Phos 38 - 126 U/L 167(H)  AST 15 - 41 U/L 25  ALT 0 - 44 U/L 13   Edinburgh Score: Edinburgh Postnatal Depression Scale Screening Tool 12/28/2019  I have been able to laugh and see the funny side of things. 0  I have looked forward with enjoyment to things. 0  I have blamed myself unnecessarily when things went wrong. 1  I have been anxious or worried for no good reason. 0  I have felt scared or panicky for no good reason. 0  Things have been getting on top of me. 0  I have been so unhappy that I have had difficulty sleeping. 0  I have felt sad or miserable. 0  I have been so unhappy  that I have been crying. 0  The thought of harming myself has occurred to me. 0  Edinburgh Postnatal Depression Scale Total 1     After visit meds:       Allergies as of 12/29/2019      Reactions   Sulfa Antibiotics Hives         Medication List    TAKE these medications   ascorbic acid 500 MG tablet Commonly known as: VITAMIN C Take 1 tablet (500 mg total) by mouth every other day. Take with iron pill   Blood Pressure Monitor Automat Devi 1 Device by Does not apply route daily. Automatic blood pressure cuff regular size. To monitor blood pressure regularly at home. ICD-10 code:Z34.90   [place] 1 Units by Does not apply route daily.   ferrous sulfate 325 (65 FE) MG EC tablet Take 1 tablet (325 mg total) by mouth every other day.   [place]ji Weight Scale Misc 1 Device by Does not apply route daily as needed. To weight self daily as needed at home. ICD-10 code: Z34.90   ibuprofen 600 MG tablet Commonly known as: ADVIL Take 1 tablet (600 mg total) by mouth every 6 (six) hours.   ondansetron 4 MG disintegrating tablet Commonly known as: Zofran ODT Take 1 tablet (4 mg total) by mouth every 8 (eight) hours as needed for nausea or vomiting.   Prenatal Complete 14-0.4 MG Tabs Take 1 tablet by mouth at bedtime.   terconazole 0.4 % vaginal cream Commonly known as: TERAZOL 7 Place 1 applicator vaginally at bedtime. For 5 nights.   URINARY HEALTH/CRANBERRY PO Take 4 tablets by mouth daily.   Vitafol Gummies 3.33-0.333-34.8 MG Chew Chew 3 each by mouth daily.        Discharge home in stable condition Infant Feeding: Breast Infant Disposition:home with mother Discharge instruction: per After Visit Summary and Postpartum booklet. Activity: Advance as tolerated. Pelvic rest for 6 weeks.  Diet: routine diet Future Appointments:No future appointments. Follow up Visit:  Follow-up Information        [place]. Schedule an appointment as soon as possible for a visit in 1 week(s).   Specialty: Obstetrics and Gynecology Contact information: [place] [address] [place], CNM  P [facility]  Please schedule this patient for Postpartum visit in: 1 week for BP check, then 4 weeks with the following provider: Any provider  BP check can be an RN visit  For C/S patients schedule nurse incision check in weeks 2 weeks: no  High risk pregnancy complicated by: gHTN  Delivery mode: SVD  Anticipated Birth Control: other/unsure  PP Procedures needed: BP check  Schedule Integrated [place] visit: no   Myrtis Ser [facility] 12/30/2019 8:19 AM

## 2019-12-30 NOTE — Lactation Note (Signed)
This note was copied from a baby's chart. Lactation Consultation Note  Patient Name: Karen Rojas FGHWE'X Date: 12/30/2019 Reason for consult: Follow-up assessment;Infant weight loss;Term;1st time breastfeeding;[place] Baby is 48 hours old, 6 % weight loss,  LC reviewed doc flow sheets within normal limits for age.  Mom expressed concern over the baby cluster feeding and sore nipples , especially on the right.  LC offered to assess breast tissue and mom receptive.  LC noted on the left an intact positional strip, right clear.Both nipple / areolas  Compressible , but short nipples noted.  Mom has breast shells and comfort gels x 6 days. LC provided the hand pump and check the flange - #24 a good fit. #27 F provided for when the milk comes in if Needed.  Sore nipple and engorgement/ tx reviewed.  LC recommended until sore nipples improve  Prior to latch - breast massage, hand express, pre-pump 8 - 10 strokes  And then latch the baby with firm support.  ( Goal is to stretch the nipple / areola complex for a deeper latch and increased flow ).  Comfort gels after feedings x 6 days alternating with shells while awake.   Mom has the shells , comfort gels, breast shells and hand pump.  Mom aware of the [facility] resource numbers and support groups for breast feeding.   Maternal Data    Feeding Feeding Type: Breast Fed  LATCH Score Latch: Grasps breast easily, tongue down, lips flanged, rhythmical sucking.  Audible Swallowing: A few with stimulation  Type of Nipple: Everted at rest and after stimulation  Comfort (Breast/Nipple): Soft / non-tender  Hold (Positioning): Assistance needed to correctly position infant at breast and maintain latch.  LATCH Score: 8  Interventions Interventions: Breast feeding basics reviewed  Lactation Tools Discussed/Used Tools: Pump;Shells;Flanges Flange Size: 24;27 Shell Type: Inverted Breast pump type: Manual Pump Review: Milk Storage;Setup,  frequency, and cleaning Initiated by:: MAI Date initiated:: 12/30/19   Consult Status Consult Status: Complete Date: 12/30/19    Kathrin Greathouse 12/30/2019, 10:26 AM

## 2020-01-02 ENCOUNTER — Telehealth (HOSPITAL_COMMUNITY): Payer: Self-pay | Admitting: Lactation Services

## 2020-01-02 NOTE — Telephone Encounter (Signed)
LC Follow Up Phone Call:  Mother had general questions regarding breast feeding, nipple and breast care and pumping.  All questions answered.  Suggested mother call back if she has any further questions/concerns.  Mother appreciative of advice given.

## 2020-01-06 ENCOUNTER — Ambulatory Visit (INDEPENDENT_AMBULATORY_CARE_PROVIDER_SITE_OTHER): Payer: Medicaid Other | Admitting: *Deleted

## 2020-01-06 ENCOUNTER — Other Ambulatory Visit: Payer: Self-pay

## 2020-01-06 VITALS — BP 111/69 | HR 111 | Temp 98.1°F | Wt 132.4 lb

## 2020-01-06 DIAGNOSIS — Z0130 Encounter for examination of blood pressure without abnormal findings: Secondary | ICD-10-CM

## 2020-01-06 DIAGNOSIS — K649 Unspecified hemorrhoids: Secondary | ICD-10-CM

## 2020-01-06 MED ORDER — HYDROCORT-PRAMOXINE (PERIANAL) 1-1 % EX FOAM: 1.0000 | Freq: Three times a day (TID) | CUTANEOUS | 1 refills | Status: DC

## 2020-01-06 NOTE — Progress Notes (Signed)
   Subjective:  Karen Rojas is a 24 y.o. female here for BP check.   Hypertension ROS: no medication side effects noted, no TIA's, no chest pain on exertion, no dyspnea on exertion, no swelling of ankles, no orthostatic dizziness or lightheadedness, no orthopnea or paroxysmal nocturnal dyspnea and no palpitations.   Patient also reported hemorrhoid pain. Patient was given medication at discharge from hospital however, no relief.  Objective:  BP 111/69 (BP Location: Left Arm, Patient Position: Sitting, Cuff Size: Normal)   Pulse (!) 111   Temp 98.1 F (36.7 C) (Oral)   Wt 132 lb 6.4 oz (60.1 kg)   LMP 02/05/2019 (Within Months)   Breastfeeding Yes   BMI 24.22 kg/m   Appearance alert, well appearing, and in no distress, oriented to person, place, and time and normal appearing weight. General exam BP noted to be well controlled today in office.    Assessment:   Blood Pressure well controlled and asymptomatic.   Plan:  Current treatment plan is effective, no change in therapy.[facility] Rx sent to pharmacy for hemorrhoid pain.  Clovis Pu, RN

## 2020-01-06 NOTE — Patient Instructions (Signed)
Hemorrhoids Hemorrhoids are swollen veins that may develop:  In the butt (rectum). These are called internal hemorrhoids.  Around the opening of the butt (anus). These are called external hemorrhoids. Hemorrhoids can cause pain, itching, or bleeding. Most of the time, they do not cause serious problems. They usually get better with diet changes, lifestyle changes, and other home treatments. What are the causes? This condition may be caused by:  Having trouble pooping (constipation).  Pushing hard (straining) to poop.  Watery poop (diarrhea).  Pregnancy.  Being very overweight (obese).  Sitting for long periods of time.  Heavy lifting or other activity that causes you to strain.  Anal sex.  Riding a bike for a long period of time. What are the signs or symptoms? Symptoms of this condition include:  Pain.  Itching or soreness in the butt.  Bleeding from the butt.  Leaking poop.  Swelling in the area.  One or more lumps around the opening of your butt. How is this diagnosed? A doctor can often diagnose this condition by looking at the affected area. The doctor may also:  Do an exam that involves feeling the area with a gloved hand (digital rectal exam).  Examine the area inside your butt using a small tube (anoscope).  Order blood tests. This may be done if you have lost a lot of blood.  Have you get a test that involves looking inside the colon using a flexible tube with a camera on the end (sigmoidoscopy or colonoscopy). How is this treated? This condition can usually be treated at home. Your doctor may tell you to change what you eat, make lifestyle changes, or try home treatments. If these do not help, procedures can be done to remove the hemorrhoids or make them smaller. These may involve:  Placing rubber bands at the base of the hemorrhoids to cut off their blood supply.  Injecting medicine into the hemorrhoids to shrink them.  Shining a type of light  energy onto the hemorrhoids to cause them to fall off.  Doing surgery to remove the hemorrhoids or cut off their blood supply. Follow these instructions at home: Eating and drinking   Eat foods that have a lot of fiber in them. These include whole grains, beans, nuts, fruits, and vegetables.  Ask your doctor about taking products that have added fiber (fibersupplements).  Reduce the amount of fat in your diet. You can do this by: ? Eating low-fat dairy products. ? Eating less red meat. ? Avoiding processed foods.  Drink enough fluid to keep your pee (urine) pale yellow. Managing pain and swelling   Take a warm-water bath (sitz bath) for 20 minutes to ease pain. Do this 3-4 times a day. You may do this in a bathtub or using a portable sitz bath that fits over the toilet.  If told, put ice on the painful area. It may be helpful to use ice between your warm baths. ? Put ice in a plastic bag. ? Place a towel between your skin and the bag. ? Leave the ice on for 20 minutes, 2-3 times a day. General instructions  Take over-the-counter and prescription medicines only as told by your doctor. ? Medicated creams and medicines may be used as told.  Exercise often. Ask your doctor how much and what kind of exercise is best for you.  Go to the bathroom when you have the urge to poop. Do not wait.  Avoid pushing too hard when you poop.  Keep your   butt dry and clean. Use wet toilet paper or moist towelettes after pooping.  Do not sit on the toilet for a long time.  Keep all follow-up visits as told by your doctor. This is important. Contact a doctor if you:  Have pain and swelling that do not get better with treatment or medicine.  Have trouble pooping.  Cannot poop.  Have pain or swelling outside the area of the hemorrhoids. Get help right away if you have:  Bleeding that will not stop. Summary  Hemorrhoids are swollen veins in the butt or around the opening of the  butt.  They can cause pain, itching, or bleeding.  Eat foods that have a lot of fiber in them. These include whole grains, beans, nuts, fruits, and vegetables.  Take a warm-water bath (sitz bath) for 20 minutes to ease pain. Do this 3-4 times a day. This information is not intended to replace advice given to you by your health care provider. Make sure you discuss any questions you have with your health care provider. Document Revised: 01/22/2018 Document Reviewed: 06/05/2017 Elsevier Patient Education  2020 [employer]  Vaginal Laceration  A vaginal laceration is a cut or tear of the opening of the vagina, the inside of the vaginal canal, or the skin between the vaginal opening and the anus (perineum). What are the causes? This condition may be caused by:  Childbirth. It may also be caused by tools that are used to help deliver a baby, such as forceps.  Sex.  An injury from sports, bike riding, or other activities.  Thinning, dryness, or irritation of the vagina due to low estrogen levels (vulvovaginal atrophy). What increases the risk? You are more likely to develop this condition if you:  Give birth vaginally.  Are sexually active.  Have gone through menopause.  Have low estrogen levels due to certain medicines, breast cancer treatments, or breastfeeding. What are the signs or symptoms? Symptoms of this condition include:  Slight to heavy vaginal bleeding.  Vaginal swelling.  Mild to severe pain.  Vaginal tenderness.  Painful urination.  Pain or discomfort during sex. How is this diagnosed? If the tear happened during childbirth, your health care provider can diagnose the tear at that time. Other tears or lacerations can be diagnosed with your medical history and a physical exam. You may have other tests, including:  Blood tests to check your hormone levels and blood loss.  Imaging tests, such as an ultrasonogram or CT scan, to rule out other health issues,  such as enlarged lymph nodes or tumors. How is this treated? Treatment depends on the severity of the tear or laceration. Minor injuries may heal on their own. If needed, this condition may be treated with:  Stitches (sutures).  Medicines, such as: ? Creams to reduce pain. ? Vaginal lubricants to treat vaginal dryness. ? Topical or oral hormonal therapy. ? Antibiotics. These may be taken orally or given as ointments to prevent or treat infection. Surgery may be needed if the tear is severe. Follow these instructions at home: Wound care  Follow instructions from your health care provider about how to take care of your wound. Make sure you: ? Wash your hands with soap and water before and after you change your bandage (dressing). If soap and water are not available, use hand sanitizer. ? Change your dressing as told by your health care provider. ? Keep the area clean. ? Leave sutures in place, if this applies. These skin closures may need  to stay in place for 2 weeks or longer.  Check your wound every day for signs of infection. Check for: ? Redness, swelling, or pain. ? Fluid or blood. ? Warmth. ? Pus or a bad smell. Managing pain and swelling   If directed, put ice on the injured area: ? Put ice in a plastic bag. ? Place a towel between your skin and the bag. ? Leave the ice on for 20 minutes, 2-3 times a day. Medicines  Take or apply over-the-counter and prescription medicines only as told by your health care provider.  If you were prescribed an antibiotic medicine, take it as told by your health care provider. Do not stop using the antibiotic even if you start to feel better.  Ask your health care provider if the medicine prescribed to you: ? Requires you to avoid driving or using heavy machinery. ? Can cause constipation. You may need to take actions to prevent or treat constipation, such as:  Drink enough fluid to keep your urine pale yellow.  Take over-the-counter or  prescription medicines.  Eat foods that are high in fiber, such as beans, whole grains, and fresh fruits and vegetables.  Limit foods that are high in fat and processed sugars, such as fried or sweet foods. General instructions   Take a sitz bath 2-3 times a day or as told by your health care provider. A sitz bath is a shallow, warm water bath that is taken while you are sitting down. The water should only come up to your hips and should cover your buttocks.  Avoid sitting or standing for long periods of time.  Lie on your side while sleeping or resting.  Avoid straining during bowel movements. Ask your health care provider if a stool softener is needed.  Do not douche, use a tampon, or have sex until your health care provider approves.  Keep all follow-up visits as told by your health care provider. This is important. Contact a health care provider if:  You have: ? More redness, swelling, or pain in the vaginal area. ? More fluid or blood coming from your vaginal tear. ? Pus or a bad smell coming from your vaginal area. ? A fever. ? A tear that breaks open after it healed or was repaired. ? A burning pain when you urinate.  You continue to have pain during sex after the tear heals.  You are urinating more often than usual or feel an increased urgency to urinate. Get help right away if you:  Feel light-headed.  Have nausea or vomiting.  Have severe pain around your vagina or in your pelvis or lower abdomen.  Have heavy vaginal bleeding, or you are soaking more than 1 pad an hour. Summary  A vaginal laceration is a cut or tear of the opening of the vagina, the inside of the vaginal canal, or the skin between the vaginal opening and the anus (perineum).  It is caused by childbirth, sex, injury, or thinning, dryness, or irritation of the vagina due to low estrogen levels.  Treatment depends on the severity of the tear or laceration. Minor injuries may heal on their own. It  may be treated with stitches, medicines, or surgery if the tear is severe. This information is not intended to replace advice given to you by your health care provider. Make sure you discuss any questions you have with your health care provider. Document Revised: 08/28/2017 Document Reviewed: 08/28/2017 Elsevier Patient Education  2020 [employer].

## 2020-02-03 ENCOUNTER — Ambulatory Visit: Payer: Medicaid Other | Admitting: Obstetrics and Gynecology

## 2020-02-17 ENCOUNTER — Encounter: Payer: Self-pay | Admitting: Obstetrics and Gynecology

## 2020-02-17 ENCOUNTER — Ambulatory Visit (INDEPENDENT_AMBULATORY_CARE_PROVIDER_SITE_OTHER): Payer: Medicaid Other | Admitting: Obstetrics and Gynecology

## 2020-02-17 ENCOUNTER — Other Ambulatory Visit: Payer: Self-pay

## 2020-02-17 DIAGNOSIS — Z3009 Encounter for other general counseling and advice on contraception: Secondary | ICD-10-CM

## 2020-02-17 NOTE — Progress Notes (Signed)
Post Partum Visit Note  Karen Rojas is a 25 y.o. G64P1001 female who presents for a postpartum visit. She is 7 weeks postpartum following a normal spontaneous vaginal delivery.  I have fully reviewed the prenatal and intrapartum course. The delivery was at 41.1 gestational weeks.  Anesthesia: epidural. Postpartum course has been uncomplicated. Baby Sharol Harness is doing well; weighing 9 lbs. Baby is feeding by breast. Bleeding no bleeding. Bowel function is normal. Bladder function is normal. Patient is not sexually active. Contraception method is none. Postpartum depression screening: negative.   The pregnancy intention screening data noted above was reviewed. Potential methods of contraception were discussed. The patient elected to proceed with FAM or LAM.    Edinburgh Postnatal Depression Scale - 02/17/20 1029      Edinburgh Postnatal Depression Scale:  In the Past 7 Days   I have been able to laugh and see the funny side of things. 0    I have looked forward with enjoyment to things. 0    I have blamed myself unnecessarily when things went wrong. 0    I have been anxious or worried for no good reason. 0    I have felt scared or panicky for no good reason. 0    Things have been getting on top of me. 0    I have been so unhappy that I have had difficulty sleeping. 0    I have felt sad or miserable. 0    I have been so unhappy that I have been crying. 0    The thought of harming myself has occurred to me. 0    Edinburgh Postnatal Depression Scale Total 0            The following portions of the patient's history were reviewed and updated as appropriate: allergies, current medications, past family history, past medical history, past social history, past surgical history and problem list.  Review of Systems Constitutional: negative Eyes: negative Ears, nose, mouth, throat, and face: negative Respiratory: negative Cardiovascular: negative Gastrointestinal:  negative Genitourinary:negative Integument/breast: negative Hematologic/lymphatic: negative Musculoskeletal:negative Neurological: negative Behavioral/Psych: negative Endocrine: negative Allergic/Immunologic: negative    Objective:  BP 112/71 (BP Location: Left Arm, Patient Position: Sitting, Cuff Size: Normal)    Pulse 68    Temp (!) 97.5 F (36.4 C) (Oral)    Ht 5\' 2"  (1.575 m)    Wt 128 lb 9.6 oz (58.3 kg)    LMP 02/05/2019 (Within Months)    Breastfeeding Yes    BMI 23.52 kg/m    General:  alert, cooperative and no distress   Breasts:  inspection negative, no nipple discharge or bleeding, no masses or nodularity palpable  Lungs: clear to auscultation bilaterally  Heart:  regular rate and rhythm, S1, S2 normal, no murmur, click, rub or gallop  Abdomen: soft, non-tender; bowel sounds normal; no masses,  no organomegaly   Vulva:  not evaluated  Vagina: not evaluated  Cervix:  not evaluated  Corpus: not examined  Adnexa:  not evaluated  Rectal Exam: Not performed.        Assessment:  Encounter for postpartum care of lactating mother - Normal postpartum exam. Pap smear not done at today's visit.  Family planning education, guidance, and counseling - Advised to space pregnancies - Condoms given  - Advised to space out pregnancies at least 18-24 months apart    Plan:   Essential components of care per ACOG recommendations:  1.  Mood and well being: Patient with negative  depression screening today. Reviewed local resources for support.  - Patient does not use tobacco.  - hx of drug use? No    2. Infant care and feeding:  -Patient currently breastmilk feeding? Yes If breastmilk feeding discussed return to work and pumping. If needed, patient was provided letter for work to allow for every 2-3 hr pumping breaks, and to be granted a private location to express breastmilk and refrigerated area to store breastmilk. Reviewed importance of draining breast regularly to support  lactation. -Social determinants of health (SDOH) reviewed in EPIC. No concerns  3. Sexuality, contraception and birth spacing - Patient does not want a pregnancy in the next year.  Desired family size is unsure of the number children.  - Reviewed forms of contraception in tiered fashion. Patient desired no method today. Condoms given.   - Discussed birth spacing of 18 months  4. Sleep and fatigue -Encouraged family/partner/community support of 4 hrs of uninterrupted sleep to help with mood and fatigue  5. Physical Recovery  - Discussed patients delivery and complications - Patient had a right and left 1st degree periurethral laceration without repair, perineal healing reviewed. Patient expressed understanding - Patient has urinary incontinence? No - Patient is safe to resume physical and sexual activity  6.  Health Maintenance - Last pap smear done 06/17/2019 and was normal.  7. NoChronic Disease - PCP follow up  Raelyn Mora, CNM [facility] [employer], [facility]

## 2020-09-19 ENCOUNTER — Other Ambulatory Visit: Payer: Self-pay | Admitting: Obstetrics and Gynecology

## 2020-09-19 DIAGNOSIS — Z3400 Encounter for supervision of normal first pregnancy, unspecified trimester: Secondary | ICD-10-CM

## 2021-04-16 ENCOUNTER — Other Ambulatory Visit: Payer: Self-pay

## 2021-04-16 ENCOUNTER — Emergency Department (HOSPITAL_COMMUNITY): Payer: Medicaid Other

## 2021-04-16 ENCOUNTER — Emergency Department (HOSPITAL_COMMUNITY)
Admission: EM | Admit: 2021-04-16 | Discharge: 2021-04-16 | Disposition: A | Discharge status: Home / Self Care | Payer: Medicaid Other | Attending: Emergency Medicine | Admitting: Emergency Medicine

## 2021-04-16 DIAGNOSIS — N9489 Other specified conditions associated with female genital organs and menstrual cycle: Secondary | ICD-10-CM | POA: Insufficient documentation

## 2021-04-16 DIAGNOSIS — D649 Anemia, unspecified: Secondary | ICD-10-CM | POA: Insufficient documentation

## 2021-04-16 DIAGNOSIS — R079 Chest pain, unspecified: Secondary | ICD-10-CM

## 2021-04-16 DIAGNOSIS — R0781 Pleurodynia: Secondary | ICD-10-CM | POA: Diagnosis present

## 2021-04-16 LAB — CBC WITH DIFFERENTIAL/PLATELET
Abs Immature Granulocytes: 0.02 10*3/uL (ref 0.00–0.07)
Basophils Absolute: 0.1 10*3/uL (ref 0.0–0.1)
Basophils Relative: 1 %
Eosinophils Absolute: 0.5 10*3/uL (ref 0.0–0.5)
Eosinophils Relative: 7 %
HCT: 36.9 % (ref 36.0–46.0)
Hemoglobin: 11.6 g/dL — ABNORMAL LOW (ref 12.0–15.0)
Immature Granulocytes: 0 %
Lymphocytes Relative: 30 %
Lymphs Abs: 2.1 10*3/uL (ref 0.7–4.0)
MCH: 25.7 pg — ABNORMAL LOW (ref 26.0–34.0)
MCHC: 31.4 g/dL (ref 30.0–36.0)
MCV: 81.8 fL (ref 80.0–100.0)
Monocytes Absolute: 0.5 10*3/uL (ref 0.1–1.0)
Monocytes Relative: 7 %
Neutro Abs: 3.8 10*3/uL (ref 1.7–7.7)
Neutrophils Relative %: 55 %
Platelets: 221 10*3/uL (ref 150–400)
RBC: 4.51 MIL/uL (ref 3.87–5.11)
RDW: 12.9 % (ref 11.5–15.5)
WBC: 7 10*3/uL (ref 4.0–10.5)
nRBC: 0 % (ref 0.0–0.2)

## 2021-04-16 LAB — COMPREHENSIVE METABOLIC PANEL
ALT: 13 U/L (ref 0–44)
AST: 18 U/L (ref 15–41)
Albumin: 3.9 g/dL (ref 3.5–5.0)
Alkaline Phosphatase: 75 U/L (ref 38–126)
Anion gap: 7 (ref 5–15)
BUN: 14 mg/dL (ref 6–20)
CO2: 24 mmol/L (ref 22–32)
Calcium: 9.2 mg/dL (ref 8.9–10.3)
Chloride: 106 mmol/L (ref 98–111)
Creatinine, Ser: 0.69 mg/dL (ref 0.44–1.00)
GFR, Estimated: >60 mL/min (ref >60)
Glucose, Bld: 91 mg/dL (ref 70–99)
Potassium: 3.9 mmol/L (ref 3.5–5.1)
Sodium: 137 mmol/L (ref 135–145)
Total Bilirubin: 0.7 mg/dL (ref 0.3–1.2)
Total Protein: 6.9 g/dL (ref 6.5–8.1)

## 2021-04-16 LAB — I-STAT BETA HCG BLOOD, ED (MC, WL, AP ONLY): I-stat hCG, quantitative: <5 m[IU]/mL (ref <5)

## 2021-04-16 LAB — D-DIMER, QUANTITATIVE: D-Dimer, Quant: <0.27 ug/mL-FEU (ref 0.00–0.50)

## 2021-04-16 LAB — LIPASE, BLOOD: Lipase: 32 U/L (ref 11–51)

## 2021-04-16 MED ORDER — KETOROLAC TROMETHAMINE 30 MG/ML IJ SOLN
30.0000 mg | Freq: Once | INTRAMUSCULAR | Status: AC
  Administered 2021-04-16: 30 mg via INTRAVENOUS
  Filled 2021-04-16: qty 1

## 2021-04-16 NOTE — ED Triage Notes (Signed)
Pt BIB EMS for CP that started yesterday, which comes and goes. She describes the pain as stabbing. Patient state she took some tylenol to relieve the pain, which helped for a little bit, but she then ran out of tylenol. Patient states that the pain gets worse with sudden movements an deep breaths. She denies n/v, stomach/back pain.  ? ?VSS w/ EMS.  ?

## 2021-04-16 NOTE — ED Provider Notes (Signed)
?[facility] ?Provider Note ? ? ?CSN: [id number] ?Arrival date & time: 04/16/21  0047 ? ?  ? ?History ? ?Chief Complaint  ?Patient presents with  ? Chest Pain  ? ? ?Delayla Ricky AlaCharmaine Muzio is a 26 y.o. female. ? ?The history is provided by the patient.  ?Chest Pain ?She complains of soreness in the right lower rib cage that started yesterday and got worse today.  Today, pain became sharp and was worse when she took a deep breath.  She denies any trauma and denies any unusual activity.  She denies fever, chills, sweats.  She denies cough.  She denies dyspnea, nausea, vomiting, diaphoresis.  Yesterday, she did take acetaminophen which gave temporary.  She is non-smoker and denies any recent travel.  There is no history of recent surgery.  She is not using any exogenous estrogens. ?  ?Home Medications ?Prior to Admission medications   ?Medication Sig Start Date End Date Taking? Authorizing Provider  ?ascorbic acid (VITAMIN C) 500 MG tablet Take 1 tablet (500 mg total) by mouth every other day. Take with iron pill ?Patient not taking: No sig reported 10/01/19   Raelyn Moraawson, Rolitta, CNM  ?Blood Pressure Monitoring (BLOOD PRESSURE MONITOR AUTOMAT) DEVI 1 Device by Does not apply route daily. Automatic blood pressure cuff regular size. To monitor blood pressure regularly at home. ICD-10 code:Z34.90 ?Patient not taking: Reported on 02/17/2020 06/17/19   Reva BoresPratt, Tanya S, MD  ?Elastic Bandages & Supports (COMFORT FIT MATERNITY SUPP MED) MISC 1 Units by Does not apply route daily. ?Patient not taking: No sig reported 10/28/19   Marny LowensteinWenzel, Julie N, PA-C  ?ferrous sulfate 325 (65 FE) MG EC tablet Take 1 tablet (325 mg total) by mouth every other day. ?Patient not taking: No sig reported 10/01/19   Raelyn Moraawson, Rolitta, CNM  ?hydrocortisone-pramoxine (PROCTOFOAM-HC) rectal foam Place 1 applicator rectally 3 (three) times daily. ?Patient not taking: Reported on 02/17/2020 01/06/20   Raelyn Moraawson, Rolitta, CNM  ?ibuprofen  (ADVIL) 600 MG tablet Take 1 tablet (600 mg total) by mouth every 6 (six) hours. ?Patient not taking: Reported on 02/17/2020 12/29/19   Aviva SignsWilliams, Marie L, CNM  ?Misc. Devices (GOJJI WEIGHT SCALE) MISC 1 Device by Does not apply route daily as needed. To weight self daily as needed at home. ICD-10 code: Z34.90 ?Patient not taking: Reported on 02/17/2020 06/17/19   Reva BoresPratt, Tanya S, MD  ?ondansetron (ZOFRAN ODT) 4 MG disintegrating tablet Take 1 tablet (4 mg total) by mouth every 8 (eight) hours as needed for nausea or vomiting. ?Patient not taking: No sig reported 06/17/19   Raelyn Moraawson, Rolitta, CNM  ?Prenatal Vit-Fe Fumarate-FA (PRENATAL COMPLETE) 14-0.4 MG TABS Take 1 tablet by mouth at bedtime. ?Patient not taking: No sig reported 05/10/19   Arthor CaptainHarris, Abigail, PA-C  ?Prenatal Vit-Fe Phos-FA-Omega (VITAFOL GUMMIES) 3.33-0.333-34.8 MG CHEW CHEW 3 EACH BY MOUTH DAILY. 09/19/20   Raelyn Moraawson, Rolitta, CNM  ?terconazole (TERAZOL 7) 0.4 % vaginal cream Place 1 applicator vaginally at bedtime. For 5 nights. ?Patient not taking: No sig reported 11/29/19   Raelyn Moraawson, Rolitta, CNM  ?URINARY HEALTH/CRANBERRY PO Take 4 tablets by mouth daily. ?Patient not taking: No sig reported    [provider]  ?   ? ?Allergies    ?Sulfa antibiotics   ? ?Review of Systems   ?Review of Systems  ?Cardiovascular:  Positive for chest pain.  ?All other systems reviewed and are negative. ? ?Physical Exam ?Updated Vital Signs ?BP 119/71   Pulse 68   Temp (!)  97.3 ?F (36.3 ?C) (Oral)   Resp 16   Ht 5\' 2"  (1.575 m)   Wt 58.3 kg   SpO2 100%   BMI 23.51 kg/m?  ?Physical Exam ?Vitals and nursing note reviewed.  ?26 year old female, resting comfortably and in no acute distress. Vital signs are normal. Oxygen saturation is 100%, which is normal. ?Head is normocephalic and atraumatic. PERRLA, EOMI. Oropharynx is clear. ?Neck is nontender and supple without adenopathy or JVD. ?Back is nontender and there is no CVA tenderness. ?Lungs are clear without rales,  wheezes, or rhonchi. ?Chest is nontender. ?Heart has regular rate and rhythm without murmur. ?Abdomen is soft, flat, nontender, but with a positive Murphy sign.  There are no masses.  Peristalsis is normoactive. ?Extremities have no cyanosis or edema, full range of motion is present. ?Skin is warm and dry without rash. ?Neurologic: Mental status is normal, cranial nerves are intact, moves all extremities equally. ? ?ED Results / Procedures / Treatments   ?Labs ?(all labs ordered are listed, but only abnormal results are displayed) ?Labs Reviewed  ?CBC WITH DIFFERENTIAL/PLATELET - Abnormal; Notable for the following components:  ?    Result Value  ? Hemoglobin 11.6 (*)   ? MCH 25.7 (*)   ? All other components within normal limits  ?D-DIMER, QUANTITATIVE  ?COMPREHENSIVE METABOLIC PANEL  ?LIPASE, BLOOD  ?I-STAT BETA HCG BLOOD, ED ([facility], [facility], [facility] ONLY)  ? ? ?EKG ?EKG Interpretation ? ?Date/Time:  Monday April 16 2021 00:50:19 EDT ?Ventricular Rate:  64 ?PR Interval:  141 ?QRS Duration: 80 ?QT Interval:  403 ?QTC Calculation: 416 ?R Axis:   60 ?Text Interpretation: Sinus rhythm Normal ECG No old tracing to compare Confirmed by 03-28-2001 (Dione Booze) on 04/16/2021 12:52:05 AM ? ?Radiology ?DG Chest 2 View ? ?Result Date: 04/16/2021 ?CLINICAL DATA:  chest pain EXAM: CHEST - 2 VIEW COMPARISON:  None. FINDINGS: The heart and mediastinal contours are within normal limits. No focal consolidation. No pulmonary edema. No pleural effusion. No pneumothorax. No acute osseous abnormality. Dextrocurvature of the thoracolumbar spine. IMPRESSION: No active cardiopulmonary disease. Electronically Signed   By: 04/18/2021 M.D.   On: 04/16/2021 01:52  ? ?04/18/2021 Abdomen Limited RUQ (LIVER/GB) ? ?Result Date: 04/16/2021 ?CLINICAL DATA:  Right upper quadrant abdominal pain EXAM: ULTRASOUND ABDOMEN LIMITED RIGHT UPPER QUADRANT COMPARISON:  None. FINDINGS: Gallbladder: No gallstones or wall thickening visualized. No sonographic Murphy sign noted by  sonographer. Common bile duct: Diameter: 3 mm in proximal diameter Liver: No focal lesion identified. Within normal limits in parenchymal echogenicity. Portal vein is patent on color Doppler imaging with normal direction of blood flow towards the liver. Other: None. IMPRESSION: Normal right upper quadrant sonogram Electronically Signed   By: 04/18/2021 M.D.   On: 04/16/2021 02:27   ? ?Procedures ?Procedures  ? ? ?Medications Ordered in ED ?Medications  ?ketorolac (TORADOL) 30 MG/ML injection 30 mg (has no administration in time range)  ? ? ?ED Course/ Medical Decision Making/ A&P ?  ?                        ?Medical Decision Making ?Amount and/or Complexity of Data Reviewed ?Labs: ordered. ?Radiology: ordered. ? ?Risk ?Prescription drug management. ? ? ?Pleuritic right-sided chest pain.  Cause is unclear.  Positive Murphy sign is suggestive of possible biliary colic.  Will check chest x-ray to rule out pneumonia.  Consider costochondritis, pulmonary embolism.  We will also check lipase to rule out pancreatitis.  Old records were reviewed, and she has no relevant past visits. ? ?Chest x-ray shows no evidence of pneumonia.  Right upper quadrant ultrasound showed no evidence of cholelithiasis.  I have independently viewed the images, and agree with radiologist's interpretation.  CBC is significant for mild anemia, metabolic panel is normal and D-dimer is normal.  She had significant relief of pain with ketorolac.  No evidence of serious pathology.  Patient is discharged with instructions to use over-the-counter analgesics as needed for pain. ? ? ? ? ? ? ? ?Final Clinical Impression(s) / ED Diagnoses ?Final diagnoses:  ?Nonspecific chest pain  ?Normochromic normocytic anemia  ? ? ?Rx / DC Orders ?ED Discharge Orders   ? ? None  ? ?  ? ? ?  ?Dione Booze, MD ?04/16/21 [phone] ? ?

## 2021-04-16 NOTE — Discharge Instructions (Signed)
Take ibuprofen, naproxen, or acetaminophen as needed for pain. ?

## 2022-03-22 LAB — POCT URINE PREGNANCY: Preg Test, Ur: POSITIVE — AB

## 2022-05-06 ENCOUNTER — Telehealth: Payer: Self-pay | Admitting: Family Medicine

## 2022-05-06 NOTE — Telephone Encounter (Signed)
Patient would like a call back from a nurse regarding abdominal pain

## 2022-05-09 NOTE — Telephone Encounter (Signed)
I called Karen Rojas and left a message I am returning her call and if she still has a question to call us back and leave a detailed message on our secure line or send a Mychart message which may be faster . Per chart review last exam 02/07/20 so needs annual exam. [name]

## 2022-05-18 ENCOUNTER — Other Ambulatory Visit: Payer: Self-pay

## 2022-05-28 ENCOUNTER — Telehealth (INDEPENDENT_AMBULATORY_CARE_PROVIDER_SITE_OTHER): Payer: Medicaid Other

## 2022-05-28 DIAGNOSIS — Z3480 Encounter for supervision of other normal pregnancy, unspecified trimester: Secondary | ICD-10-CM | POA: Insufficient documentation

## 2022-05-28 DIAGNOSIS — Z3689 Encounter for other specified antenatal screening: Secondary | ICD-10-CM

## 2022-05-28 HISTORY — DX: Encounter for supervision of other normal pregnancy, unspecified trimester: Z34.80

## 2022-05-28 MED ORDER — BLOOD PRESSURE MONITORING DEVI: 1.0000 | 0 refills | Status: DC

## 2022-05-28 MED ORDER — VITAFOL GUMMIES 3.33-0.333-34.8 MG PO CHEW: 1.0000 | CHEWABLE_TABLET | Freq: Every day | ORAL | 4 refills | Status: DC

## 2022-05-28 NOTE — Progress Notes (Signed)
New OB Intake  I connected with Karen Rojas  on 05/28/22 at 10:15 AM EDT by MyChart Video Visit and verified that I am speaking with the correct person using two identifiers. Nurse is located at [facility] and pt is located at home.  I discussed the limitations, risks, security and privacy concerns of performing an evaluation and management service by telephone and the availability of in person appointments. I also discussed with the patient that there may be a patient responsible charge related to this service. The patient expressed understanding and agreed to proceed.  I explained I am completing New OB Intake today. We discussed EDD of 11/13/22 that is based on LMP of 02/06/22. Pt is G2/P1. I reviewed her allergies, medications, Medical/Surgical/OB history, and appropriate screenings. I informed her of [facility] services. [facility] information placed in AVS. Based on history, this is a low risk pregnancy.  Patient Active Problem List   Diagnosis Date Noted   Post term pregnancy at [redacted] weeks gestation 12/27/2019    Concerns addressed today  Delivery Plans Plans to deliver at [facility]. Patient given information for [facility] Healthy Baby website for more information about [facility]. Patient is not interested in water birth. Offered upcoming OB visit with CNM to discuss further.  MyChart/Babyscripts MyChart access verified. I explained pt will have some visits in office and some virtually. Babyscripts instructions given and order placed. Patient verifies receipt of registration text/e-mail. Account successfully created and app downloaded.  Blood Pressure Cuff/Weight Scale Blood pressure cuff ordered for patient to pick-up from [employer]. Explained after first prenatal appt pt will check weekly and document in Babyscripts. Patient does have weight scale.  Anatomy US Explained first scheduled [place] will be around 19 weeks. Anatomy US scheduled for 07/03/22 at 0245p. Pt notified  to arrive at 0230p.  Labs Discussed Avelina Laine genetic screening with patient. Would like both Panorama and Horizon drawn at new OB visit. Routine prenatal labs needed.  COVID Vaccine Patient has not had COVID vaccine.   Is patient a CenteringPregnancy candidate?  Declined Declined due to Schedule, Pt states that she has no one to watch her child for AM schedule.   Is patient a Mom+Baby Combined Care candidate?  Not a candidate    Social Determinants of Health Food Insecurity: Patient denies food insecurity. WIC Referral: Patient is interested in referral to [facility].  Transportation: Patient denies transportation needs. Childcare: Discussed no children allowed at ultrasound appointments. Offered childcare services; patient declines childcare services at this time.  Interested in [place]? If yes, send referral and doula dot phrase.   First visit review I reviewed new OB appt with patient. Explained pt will be seen by Dr.Arnold at first visit; encounter routed to appropriate provider. Explained that patient will be seen by pregnancy navigator following visit with provider.   Henrietta Dine, CMA 05/28/2022  10:29 AM

## 2022-06-04 ENCOUNTER — Other Ambulatory Visit: Payer: Self-pay

## 2022-06-04 ENCOUNTER — Other Ambulatory Visit (HOSPITAL_COMMUNITY)
Admission: RE | Admit: 2022-06-04 | Discharge: 2022-06-04 | Disposition: A | Discharge status: Home / Self Care | Payer: Medicaid Other | Source: Ambulatory Visit | Attending: Obstetrics & Gynecology | Admitting: Obstetrics & Gynecology

## 2022-06-04 ENCOUNTER — Ambulatory Visit (INDEPENDENT_AMBULATORY_CARE_PROVIDER_SITE_OTHER): Payer: Medicaid Other | Admitting: Obstetrics & Gynecology

## 2022-06-04 VITALS — BP 106/65 | HR 83 | Wt 113.8 lb

## 2022-06-04 DIAGNOSIS — Z148 Genetic carrier of other disease: Secondary | ICD-10-CM | POA: Insufficient documentation

## 2022-06-04 DIAGNOSIS — Z3480 Encounter for supervision of other normal pregnancy, unspecified trimester: Secondary | ICD-10-CM | POA: Diagnosis not present

## 2022-06-04 DIAGNOSIS — D563 Thalassemia minor: Secondary | ICD-10-CM | POA: Insufficient documentation

## 2022-06-04 DIAGNOSIS — Z3A36 36 weeks gestation of pregnancy: Secondary | ICD-10-CM | POA: Diagnosis not present

## 2022-06-04 DIAGNOSIS — Z3483 Encounter for supervision of other normal pregnancy, third trimester: Secondary | ICD-10-CM | POA: Diagnosis not present

## 2022-06-04 DIAGNOSIS — Z363 Encounter for antenatal screening for malformations: Secondary | ICD-10-CM | POA: Diagnosis not present

## 2022-06-04 NOTE — Progress Notes (Signed)
  Subjective:feels flutters    Karen Rojas is a G2P1001 [redacted]w[redacted]d being seen today for her first obstetrical visit.  Her obstetrical history is significant for  h/o postdates delivery . Patient does intend to breast feed. Pregnancy history fully reviewed.  Patient reports no complaints.  Vitals:   06/04/22 0946  BP: 106/65  Pulse: 83  Weight: 113 lb 12.8 oz (51.6 kg)    HISTORY: OB History  Gravida Para Term Preterm AB Living  2 1 1  0 0 1  SAB IAB Ectopic Multiple Live Births  0 0 0 0 1    # Outcome Date GA Lbr Len/2nd Weight Sex Delivery Anes PTL Lv  2 Current           1 Term 12/28/19 [redacted]w[redacted]d 34:20 / 00:26 7 lb 5.6 oz (3.335 kg) F Vag-Spont EPI  LIV   Past Medical History:  Diagnosis Date   Asthma    years since last used inhaler   Urinary tract bacterial infections    Past Surgical History:  Procedure Laterality Date   NO PAST SURGERIES     Family History  Problem Relation Age of Onset   Kidney disease Mother      Exam    Uterus:   49  Pelvic Exam: Pelvic deferred per her request   Perineum:    Vulva:    Vagina:     pH:    Cervix:    Adnexa:    Bony Pelvis:   System: Breast:  deferred   Skin: normal coloration and turgor, no rashes    Neurologic: oriented, normal mood   Extremities: normal strength, tone, and muscle mass   HEENT PERRLA and sclera clear, anicteric   Mouth/Teeth dental hygiene good   Neck supple   Cardiovascular: regular rate and rhythm, no murmurs or gallops   Respiratory:  appears well, vitals normal, no respiratory distress, acyanotic, normal RR, neck free of mass or lymphadenopathy, chest clear, no wheezing, crepitations, rhonchi, normal symmetric air entry   Abdomen: soft, non-tender; bowel sounds normal; no masses,  no organomegaly   Urinary:       Assessment:    Pregnancy: G2P1001 Patient Active Problem List   Diagnosis Date Noted   Alpha thalassemia silent carrier 06/04/2022   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 Prenatal vitamins. Problem list reviewed and updated. Genetic Screening discussed : ordered.  Ultrasound discussed; fetal survey: ordered.  Follow up in 4 weeks. 50% of 30 min visit spent on counseling and coordination of care.  Questions were answered   Scheryl Darter 06/04/2022

## 2022-06-05 LAB — GC/CHLAMYDIA PROBE AMP (~~LOC~~) NOT AT ARMC
Chlamydia: NEGATIVE
Comment: NEGATIVE
Comment: NORMAL
Neisseria Gonorrhea: NEGATIVE

## 2022-06-06 LAB — AFP, SERUM, OPEN SPINA BIFIDA
AFP MoM: 0.87
AFP Value: 41.9 ng/mL
Gest. Age on Collection Date: 16.6 weeks
Maternal Age At EDD: 27.3 yr
OSBR Risk 1 IN: 10000
Test Results:: NEGATIVE
Weight: 114 [lb_av]

## 2022-06-06 LAB — CBC/D/PLT+RPR+RH+ABO+RUBIGG...
Antibody Screen: NEGATIVE
Basophils Absolute: 0 10*3/uL (ref 0.0–0.2)
Basos: 1 %
EOS (ABSOLUTE): 0.2 10*3/uL (ref 0.0–0.4)
Eos: 4 %
HCV Ab: NONREACTIVE
HIV Screen 4th Generation wRfx: NONREACTIVE
Hematocrit: 31.3 % — ABNORMAL LOW (ref 34.0–46.6)
Hemoglobin: 10.1 g/dL — ABNORMAL LOW (ref 11.1–15.9)
Hepatitis B Surface Ag: NEGATIVE
Immature Grans (Abs): 0 10*3/uL (ref 0.0–0.1)
Immature Granulocytes: 0 %
Lymphocytes Absolute: 1.6 10*3/uL (ref 0.7–3.1)
Lymphs: 29 %
MCH: 25.4 pg — ABNORMAL LOW (ref 26.6–33.0)
MCHC: 32.3 g/dL (ref 31.5–35.7)
MCV: 79 fL (ref 79–97)
Monocytes Absolute: 0.3 10*3/uL (ref 0.1–0.9)
Monocytes: 6 %
Neutrophils Absolute: 3.3 10*3/uL (ref 1.4–7.0)
Neutrophils: 60 %
Platelets: 245 10*3/uL (ref 150–450)
RBC: 3.98 x10E6/uL (ref 3.77–5.28)
RDW: 12.9 % (ref 11.7–15.4)
RPR Ser Ql: NONREACTIVE
Rh Factor: POSITIVE
Rubella Antibodies, IGG: 2.75 index (ref >0.99)
WBC: 5.4 10*3/uL (ref 3.4–10.8)

## 2022-06-06 LAB — HEMOGLOBIN A1C
Est. average glucose Bld gHb Est-mCnc: 103 mg/dL
Hgb A1c MFr Bld: 5.2 % (ref 4.8–5.6)

## 2022-06-06 LAB — CULTURE, OB URINE

## 2022-06-06 LAB — HCV INTERPRETATION

## 2022-06-06 LAB — URINE CULTURE, OB REFLEX

## 2022-06-11 LAB — PANORAMA PRENATAL TEST FULL PANEL:PANORAMA TEST PLUS 5 ADDITIONAL MICRODELETIONS: FETAL FRACTION: 11.8

## 2022-07-02 ENCOUNTER — Encounter: Payer: Medicaid Other | Admitting: Advanced Practice Midwife

## 2022-07-03 ENCOUNTER — Ambulatory Visit: Payer: Medicaid Other | Attending: Obstetrics & Gynecology

## 2022-07-03 ENCOUNTER — Encounter: Payer: Self-pay | Admitting: *Deleted

## 2022-07-03 ENCOUNTER — Ambulatory Visit: Payer: Medicaid Other | Admitting: *Deleted

## 2022-07-03 VITALS — BP 112/65 | HR 70

## 2022-07-03 DIAGNOSIS — Z3A21 21 weeks gestation of pregnancy: Secondary | ICD-10-CM | POA: Diagnosis not present

## 2022-07-03 DIAGNOSIS — Z3689 Encounter for other specified antenatal screening: Secondary | ICD-10-CM | POA: Diagnosis not present

## 2022-07-03 DIAGNOSIS — D563 Thalassemia minor: Secondary | ICD-10-CM | POA: Insufficient documentation

## 2022-07-03 DIAGNOSIS — Z3480 Encounter for supervision of other normal pregnancy, unspecified trimester: Secondary | ICD-10-CM

## 2022-07-03 DIAGNOSIS — Z363 Encounter for antenatal screening for malformations: Secondary | ICD-10-CM | POA: Insufficient documentation

## 2022-07-17 ENCOUNTER — Encounter: Payer: Medicaid Other | Admitting: Obstetrics and Gynecology

## 2022-07-17 ENCOUNTER — Ambulatory Visit: Payer: Self-pay | Admitting: Obstetrics and Gynecology

## 2022-07-17 ENCOUNTER — Ambulatory Visit: Payer: Medicaid Other | Attending: Obstetrics and Gynecology | Admitting: Obstetrics and Gynecology

## 2022-07-17 DIAGNOSIS — D563 Thalassemia minor: Secondary | ICD-10-CM

## 2022-07-17 DIAGNOSIS — Z148 Genetic carrier of other disease: Secondary | ICD-10-CM

## 2022-07-17 NOTE — Progress Notes (Signed)
Virtual Visit via Video Note  I connected with Karen Rojas on 07/17/22 at 12:00 PM EDT by a video enabled telemedicine application and verified that I am speaking with the correct person using two identifiers.  Location: Patient: home Provider: [facility] [place]   I discussed the limitations of evaluation and management by telemedicine and the availability of in person appointments. The patient expressed understanding and agreed to proceed.  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  She was present at the visit alone.   Karen Rojas had Horizon 14 carrier screening performed through [place] during her pregancy in 2018, but never received genetic counseling to review those results. The results of the screen identified her as a silent carrier for alpha-thalassemia (aa/a-) and to be at increased risk for being a carrier for spinal muscular atrophy (SMA). The screening also included testing for cystic fibrosis and Beta-hemoglobin abnormalities including sickle cell disease and beta thalassemia and 10 other conditions.  The screening was negative for these conditions, which greatly reduces but cannot eliminate the chance that she is a carrier for these conditions. See that report for details and residual risk estimates.  Alpha thalassemia: Alpha-thalassemia is different in its inheritance compared to other hemoglobinopathies as there are two copies of two alpha globin genes (HBA1 and HBA2) on each chromosome 16, or four alpha globin genes total (aa/aa). A person can be a carrier of one alpha gene mutation (aa/a-), also referred to as a "silent carrier". A person who carries two alpha globin gene  mutations can either carry them in cis (both on the same chromosome, denoted as aa/--) or in trans (on different chromosomes, denoted as a-/a-). Alpha-thalassemia carriers of two mutations who have African American ancestry are more likely to have a trans arrangement (a-/a-); cis configuration is reported to be rare in individuals with African American ancestry.     There are several different forms of alpha-thalassemia. The most severe form of alpha-thalassemia, Hb Barts, is associated with an absence of alpha globin chain synthesis as a result of deletions of all four alpha globin genes (--/--).  Given that Karen Rojas is a silent carrier (aa/a-), her pregnancies would not be at increased risk for Hb Barts, even if her partner is a carrier for alpha-thalassemia, as she will always pass on at least one copy of the alpha globin gene to her children. Hemoglobin H (HbH) disease is caused by three deleted or dysfunctioning alpha globin alleles (a-/--) and is characterized by microcytic hypochromic hemolytic anemia, hepatosplenomegaly, mild jaundice, growth retardation, and sometimes thalassemia-like bone changes. Given Karen Rojas's silent carrier status (aa/a-), the current fetus would only be at risk for HbH disease (a-/--), if her partner is a carrier for two alpha globin mutations in cis (aa/--). If this is the case, the risk for HbH disease in the pregnancy would be 1 in 4 (25%). However, if Karen Rojas's partner is a carrier for two alpha globin mutations, he would be more likely to carry them in trans configuration (a-/a-) than the cis configuration (aa/--), given his ethnicity. If he is a carrier of alpha-thalassemia in trans, then the pregnancy would not be at increased risk for HbH disease and would at  most be a carrier of two gene changes in trans (a-/a-).  Carriers may have mild anemia, or small red blood cells (low MCV on CBC), but are expected to be healthy. Based on the carrier frequency for  alpha-thalassemia in the African American population, Karen Rojas partner has a 1 in 30 chance of being any type of carrier for alpha-thalassemia. We also discussed that if both parents are known to be carriers, then testing during pregnancy through amniocentesis or CVS would be made available.  Spinal Muscular Atrophy: SMA is a recessive genetic condition with variable age of onset and severity caused by mutations in the SMN1 gene.  To review, a recessive condition occurs in a child when both the mother and the father are carriers of the genetic condition and both pass on that altered gene to the child.  The features of SMA are caused by the loss of motor neurons which leads to progressive muscle weakness and muscle wasting (atrophy) in infancy.  There are several types of SMA based upon severity, but in the most common type (type 1), children may pass away as early as 37 years of age.  Carrier testing assesses the number of copies of the SMN1 gene, from zero to four.  Persons with one copy of the SMN1 gene are carriers, and those with no copies are affected with the condition.  Individuals with two or more copies have a reduced chance to be a carrier.  Persons with 3 or 4 copies of this gene are highly unlikely to be carriers.  However, someone with two copies of the gene can either have two copies of this gene on separate chromosomes and thus not be a carrier or they could have two copies of this gene on the same chromosome with no copies on the other chromosome and be a "silent" carrier.  This silent carrier status is known to be more common in persons of African American or Ashkenazi Jewish ancestry, particularly when they are found to be positive for a variant in the gene known as c. *3+80T>G. The results revealed that Karen Rojas has an SMN1 copy number of 2 and is positive for the c. *3+80T>G SNP, thus increasing her chance to be a carrier from 1 in 72 to 1 in 50.  This testing cannot confirm or eliminate  the chance to have a child with SMA. It is estimated that current carrier screening can detect 94.8% of carriers in the Caucasian population and 70.5% of carriers in the African American population.    Then next option we reviewed is to have her partner tested to determine his status.  If he is found to be a carrier, then the pregnancy may be at increased risk for SMA and testing would be offered during the pregnancy through amniocentesis or at the time of birth. At this time, prior to testing, Karen Rojas has a chance of 1 in 30 to be a carrier given his African American ancestry and no known family history of SMA.  Overall, the chance for this couple to have a child with SMA at this time is 1/72 X 1/34 X = 1 in 9,792. If he were to have testing that showed a low chance for him to be a carrier, this number could be decreased significantly.  If his testing were to show that he is a carrier, then the number would be increased.   We also talked about the possibility of testing at the time of delivery. Beginning in 2021, all  newborn babies in [place] are test for SMA as well as numerous other conditions as part of state mandated newborn screening. Testing on cord blood or of the baby sometime after delivery could also be ordered if desired.  Aneuploidy screening: The results of the [employer] cell free DNA testing were also reviewed.  These results showed a less than 1 in 10,000 risk for trisomies 21, 18 and 13, and monosomy X (Turner syndrome). In addition, the risk for triploidy and sex chromosome trisomies (47,XXX and 47,XXY) was also low. While this testing identifies 94-99% of pregnancies with trisomy 73, trisomy 42, and trisomy 75, and >70% of cases of sex chromosome aneuploidies, it is NOT diagnostic. A positive test result requires confirmation by CVS or amniocentesis, and a negative test result does not rule out a fetal chromosome abnormality. This testing does not identify all genetic conditions.  Screening  for open neural tube defects was previously performed through a maternal serum AFP only test.  Those results are within normal limits and showed a chance of 1 in 10,000 for an ONTD. This blood test can detect 80% of babies with open neural tube defects.   Family/Pregnancy history: We also obtained a detailed family history and pregnancy history.  The patient stated that this is the second pregnancy for she and her partner, Karen Rojas.  They have a healthy 54 year old daughter.  She denied any complications or exposure to medications, tobacco, alcohol or recreational drugs in the current pregnancy. The patient stated that [facility] and several of his family members have "digestive issues", however, there is no clear diagnosis for the condition and no treatment or surgical interventions have been done.  Without additional medical information, we cannot accurately evaluate the possible inherited factors for this condition.  Karen Rojas also reported that her maternal half sister has a son with spina bifida.  He is now 86 years old and has the expected difficulties with mobility but is otherwise in good health with no known cognitive or other concerns.  We discussed possible causes for neural tube defects. Most often, neural tube defects occur as an isolated birth difference with a suspected multifactorial inheritance pattern, a combination of genetic and environmental factors including low folic acid in some cases.  However, some are the result of changes in genes or in the number or structure of the chromosomes and occur as part of a genetic syndrome. In the absence of an underlying chromosome condition or single-gene defect, the chance for spina bifida or an open neural tube defect in a cousin of an affected individual is less than 1%. We can evaluate for spina bifida in pregnancy through a detailed ultrasound and a maternal serum AFP blood test , both of which have been normal in this pregnancy.   Another testing option is  amniocentesis, which can be used to measure the AFP in the amniotic fluid as well as acetylcholinesterase levels to detect 98% of babies with an open neural tube defect. Amniocentesis is an invasive test, which has a risk of approximately 1 in 500 for complications which would lead to miscarriage.  The remainder of the family history was unremarkable for birth defects, intellectual disabilities, recurrent pregnancy loss or known genetic conditions.  Plan of care: Karen Rojas declined additional testing today but stated that she would speak with her partner, Karen Rojas, and call back to our clinic if they desire for him to have any carrier screening for alpha thalassemia or SMA. Given family history of spina bifida, we  reviewed the prior normal u/s and AFP, both of which significantly reduce the chance for this condition in this pregnancy.  We appreciate being involved in the care of this patient and can be reached at [phone] with any questions or concerns.   Cherly Anderson, MS, CGC  I provided 40 minutes of non-face-to-face time during this encounter.   Katrina Stack

## 2022-08-21 ENCOUNTER — Ambulatory Visit: Payer: Medicaid Other

## 2022-08-22 ENCOUNTER — Other Ambulatory Visit: Payer: Self-pay | Admitting: Lactation Services

## 2022-08-22 DIAGNOSIS — Z3480 Encounter for supervision of other normal pregnancy, unspecified trimester: Secondary | ICD-10-CM

## 2022-08-28 ENCOUNTER — Other Ambulatory Visit: Payer: Self-pay

## 2022-08-28 ENCOUNTER — Other Ambulatory Visit: Payer: Medicaid Other

## 2022-08-28 ENCOUNTER — Ambulatory Visit (INDEPENDENT_AMBULATORY_CARE_PROVIDER_SITE_OTHER): Payer: Medicaid Other | Admitting: Obstetrics and Gynecology

## 2022-08-28 VITALS — BP 110/73 | HR 83 | Wt 133.0 lb

## 2022-08-28 DIAGNOSIS — Z3483 Encounter for supervision of other normal pregnancy, third trimester: Secondary | ICD-10-CM

## 2022-08-28 DIAGNOSIS — Z3480 Encounter for supervision of other normal pregnancy, unspecified trimester: Secondary | ICD-10-CM

## 2022-08-28 DIAGNOSIS — D563 Thalassemia minor: Secondary | ICD-10-CM

## 2022-08-28 DIAGNOSIS — Z148 Genetic carrier of other disease: Secondary | ICD-10-CM

## 2022-08-28 DIAGNOSIS — Z3A29 29 weeks gestation of pregnancy: Secondary | ICD-10-CM

## 2022-08-28 NOTE — Progress Notes (Signed)
Patient needs clearance letter for dentist. Letter was printed for patient   Tdap vaccine was explained and offered to patient. However, she declined.   Felishia Wartman, CCMA

## 2022-08-28 NOTE — Progress Notes (Signed)
   PRENATAL VISIT NOTE  Subjective:  Karen Rojas is a 27 y.o. G2P1001 at [redacted]w[redacted]d being seen today for ongoing prenatal care.  She is currently monitored for the following issues for this low-risk pregnancy and has Supervision of other normal pregnancy, antepartum; Alpha thalassemia silent carrier; and Carrier of spinal muscular atrophy on their problem list.  Patient doing well with no acute concerns today. She reports no complaints.  Contractions: Not present. Vag. Bleeding: None.  Movement: Present. Denies leaking of fluid.   The following portions of the patient's history were reviewed and updated as appropriate: allergies, current medications, past family history, past medical history, past social history, past surgical history and problem list. Problem list updated.  Objective:   Vitals:   08/28/22 0919  BP: 110/73  Pulse: 83  Weight: 133 lb (60.3 kg)    Fetal Status: Fetal Heart Rate (bpm): 132 Fundal Height: 29 cm Movement: Present     General:  Alert, oriented and cooperative. Patient is in no acute distress.  Skin: Skin is warm and dry. No rash noted.   Cardiovascular: Normal heart rate noted  Respiratory: Normal respiratory effort, no problems with respiration noted  Abdomen: Soft, gravid, appropriate for gestational age.  Pain/Pressure: Absent     Pelvic: Cervical exam deferred        Extremities: Normal range of motion.     Mental Status:  Normal mood and affect. Normal behavior. Normal judgment and thought content.   Assessment and Plan:  Pregnancy: G2P1001 at [redacted]w[redacted]d  1. [redacted] weeks gestation of pregnancy   2. Supervision of other normal pregnancy, antepartum Continue routine prenatal care  3. Carrier of spinal muscular atrophy Pt has had genetic counseling  4. Alpha thalassemia silent carrier See above  Preterm labor symptoms and general obstetric precautions including but not limited to vaginal bleeding, contractions, leaking of fluid and fetal  movement were reviewed in detail with the patient.  Please refer to After Visit Summary for other counseling recommendations.   Return in about 2 weeks (around 09/11/2022) for ROB, virtual.   Mariel Aloe, MD Faculty Attending Center for [facility]

## 2022-09-04 ENCOUNTER — Encounter: Payer: Self-pay | Admitting: Obstetrics and Gynecology

## 2022-09-10 ENCOUNTER — Telehealth (INDEPENDENT_AMBULATORY_CARE_PROVIDER_SITE_OTHER): Payer: Medicaid Other | Admitting: Family Medicine

## 2022-09-10 ENCOUNTER — Encounter: Payer: Self-pay | Admitting: Family Medicine

## 2022-09-10 VITALS — BP 122/76 | HR 92

## 2022-09-10 DIAGNOSIS — Z3480 Encounter for supervision of other normal pregnancy, unspecified trimester: Secondary | ICD-10-CM

## 2022-09-10 DIAGNOSIS — Z3A30 30 weeks gestation of pregnancy: Secondary | ICD-10-CM

## 2022-09-10 NOTE — Progress Notes (Signed)
   OBSTETRICS PRENATAL VIRTUAL VISIT ENCOUNTER NOTE  Provider location: Center for [facility]   Patient location: Home  I connected with Karen Rojas on 09/10/22 at  2:55 PM EDT by MyChart Video Encounter and verified that I am speaking with the correct person using two identifiers. I discussed the limitations, risks, security and privacy concerns of performing an evaluation and management service virtually and the availability of in person appointments. I also discussed with the patient that there may be a patient responsible charge related to this service. The patient expressed understanding and agreed to proceed. Subjective:  Karen Rojas is a 27 y.o. G2P1001 at [redacted]w[redacted]d being seen today for ongoing prenatal care.  She is currently monitored for the following issues for this low-risk pregnancy and has Supervision of other normal pregnancy, antepartum; Alpha thalassemia silent carrier; and Carrier of spinal muscular atrophy on their problem list.  Patient reports no complaints.  Contractions: Irritability. Vag. Bleeding: None.  Movement: Present. Denies any leaking of fluid.   The following portions of the patient's history were reviewed and updated as appropriate: allergies, current medications, past family history, past medical history, past social history, past surgical history and problem list.   Objective:   Vitals:   09/10/22 1404  BP: 122/76  Pulse: 92    Fetal Status:     Movement: Present     General:  Alert, oriented and cooperative. Patient is in no acute distress.  Respiratory: Normal respiratory effort, no problems with respiration noted  Mental Status: Normal mood and affect. Normal behavior. Normal judgment and thought content.  Rest of physical exam deferred due to type of encounter  Imaging: No results found.  Assessment and Plan:  Pregnancy: G2P1001 at [redacted]w[redacted]d 1. Supervision of other normal pregnancy,  antepartum Continue routine prenatal care.   2. [redacted] weeks gestation of pregnancy   Preterm labor symptoms and general obstetric precautions including but not limited to vaginal bleeding, contractions, leaking of fluid and fetal movement were reviewed in detail with the patient. I discussed the assessment and treatment plan with the patient. The patient was provided an opportunity to ask questions and all were answered. The patient agreed with the plan and demonstrated an understanding of the instructions. The patient was advised to call back or seek an in-person office evaluation/go to MAU at [facility] for any urgent or concerning symptoms. Please refer to After Visit Summary for other counseling recommendations.   I provided 6 minutes of face-to-face time during this encounter.  Return in 2 weeks (on 09/24/2022).  Future Appointments  Date Time Provider Department Center  09/10/2022  2:55 PM Reva Bores, MD [facility]    Reva Bores, MD Center for [facility], [facility]

## 2022-09-27 ENCOUNTER — Inpatient Hospital Stay (HOSPITAL_COMMUNITY)
Admission: AD | Admit: 2022-09-27 | Discharge: 2022-09-27 | Disposition: A | Discharge status: Home / Self Care | Payer: Medicaid Other | Attending: Obstetrics and Gynecology | Admitting: Obstetrics and Gynecology

## 2022-09-27 ENCOUNTER — Encounter (HOSPITAL_COMMUNITY): Payer: Self-pay | Admitting: Obstetrics and Gynecology

## 2022-09-27 DIAGNOSIS — O479 False labor, unspecified: Secondary | ICD-10-CM | POA: Diagnosis not present

## 2022-09-27 DIAGNOSIS — O4703 False labor before 37 completed weeks of gestation, third trimester: Secondary | ICD-10-CM | POA: Insufficient documentation

## 2022-09-27 DIAGNOSIS — O99283 Endocrine, nutritional and metabolic diseases complicating pregnancy, third trimester: Secondary | ICD-10-CM | POA: Insufficient documentation

## 2022-09-27 DIAGNOSIS — Z3A33 33 weeks gestation of pregnancy: Secondary | ICD-10-CM | POA: Diagnosis not present

## 2022-09-27 DIAGNOSIS — Z87891 Personal history of nicotine dependence: Secondary | ICD-10-CM | POA: Diagnosis not present

## 2022-09-27 DIAGNOSIS — E860 Dehydration: Secondary | ICD-10-CM

## 2022-09-27 LAB — POCT FERN TEST: POCT Fern Test: NEGATIVE

## 2022-09-27 LAB — URINALYSIS, ROUTINE W REFLEX MICROSCOPIC
Bilirubin Urine: NEGATIVE
Glucose, UA: NEGATIVE mg/dL
Hgb urine dipstick: NEGATIVE
Ketones, ur: NEGATIVE mg/dL
Leukocytes,Ua: NEGATIVE
Nitrite: NEGATIVE
Protein, ur: NEGATIVE mg/dL
Specific Gravity, Urine: 1.001 — ABNORMAL LOW (ref 1.005–1.030)
pH: 7 (ref 5.0–8.0)

## 2022-09-27 LAB — WET PREP, GENITAL
Clue Cells Wet Prep HPF POC: NONE SEEN
Sperm: NONE SEEN
Trich, Wet Prep: NONE SEEN
WBC, Wet Prep HPF POC: <10 (ref <10)
Yeast Wet Prep HPF POC: NONE SEEN

## 2022-09-27 MED ORDER — NIFEDIPINE 10 MG PO CAPS: 10.0000 mg | ORAL_CAPSULE | ORAL | 1 refills | Status: DC | PRN

## 2022-09-27 MED ORDER — NIFEDIPINE 10 MG PO CAPS
10.0000 mg | ORAL_CAPSULE | ORAL | Status: DC | PRN
  Administered 2022-09-27 (×2): 10 mg via ORAL
  Filled 2022-09-27 (×3): qty 1

## 2022-09-27 NOTE — MAU Note (Signed)
Karen Rojas is a 27 y.o. at [redacted]w[redacted]d here in MAU reporting: been having contractions, started around 1000 this morning. Coming every 7 min.  Little clear d/c , noted since this morning- keeps feeling it.  No bleeding. Reports +FM  Onset of complaint: 1000 Pain score: 4 Vitals:   09/27/22 1521  BP: 117/68  Pulse: 73  Resp: 18  Temp: 98.6 F (37 C)  SpO2: 99%     FHT:136 Lab orders placed from triage:

## 2022-09-27 NOTE — MAU Provider Note (Signed)
Chief Complaint: Contractions and Rupture of Membranes   Event Date/Time   First Provider Initiated Contact with Patient 09/27/22 1539      SUBJECTIVE HPI: Karen Rojas is a 27 y.o. G2P1001 at [redacted]w[redacted]d by LMP who presents to maternity admissions reporting pelvic pressure and irregular ctx.  Notes increase in discharge today. Thin discharge. Not continuing to Does not think her water broke. Feels more pelvic pressure. Comes and goes. Feeling mild cramping. +FM.  She denies vaginal bleeding, vaginal itching/burning, urinary symptoms, h/a, dizziness, n/v, or fever/chills.    HPI  Past Medical History:  Diagnosis Date   Asthma    years since last used inhaler   Urinary tract bacterial infections    Past Surgical History:  Procedure Laterality Date   NO PAST SURGERIES     Social History   Socioeconomic History   Marital status: Single    Spouse name: Not on file   Number of children: Not on file   Years of education: Not on file   Highest education level: High school graduate  Occupational History   Not on file  Tobacco Use   Smoking status: Former    Current packs/day: 0.00    Types: Cigarettes    Quit date: 2019    Years since quitting: 5.6   Smokeless tobacco: Never  Vaping Use   Vaping status: Never Used  Substance and Sexual Activity   Alcohol use: Not Currently    Alcohol/week: 0.0 standard drinks of alcohol   Drug use: Not Currently    Types: Marijuana    Comment: occasional been a couple of years   Sexual activity: Yes    Partners: Male    Birth control/protection: None  Other Topics Concern   Not on file  Social History Narrative   Not on file   Social Determinants of Health   Financial Resource Strain: Not on file  Food Insecurity: No Food Insecurity (06/04/2022)   Hunger Vital Sign    Worried About Running Out of Food in the Last Year: Never true    Ran Out of Food in the Last Year: Never true  Transportation Needs: No Transportation Needs  (06/04/2022)   PRAPARE - Administrator, Civil Service (Medical): No    Lack of Transportation (Non-Medical): No  Physical Activity: Not on file  Stress: Not on file  Social Connections: Not on file  Intimate Partner Violence: Not on file   No current facility-administered medications on file prior to encounter.   Current Outpatient Medications on File Prior to Encounter  Medication Sig Dispense Refill   acetaminophen (TYLENOL) 500 MG tablet Take 1,000 mg by mouth every 6 (six) hours as needed for moderate pain or headache.     Prenatal Vit-Fe Phos-FA-Omega (VITAFOL GUMMIES) 3.33-0.333-34.8 MG CHEW Chew 1 tablet by mouth daily. 90 tablet 4   Blood Pressure Monitoring DEVI 1 each by Does not apply route once a week. 1 each 0   URINARY HEALTH/CRANBERRY PO Take 4 tablets by mouth daily. (Patient not taking: Reported on 07/30/2019)     Allergies  Allergen Reactions   Sulfa Antibiotics Hives    ROS:  Pertinent positives/negatives listed above.  I have reviewed patient's Past Medical Hx, Surgical Hx, Family Hx, Social Hx, medications and allergies.   Physical Exam  Patient Vitals for the past 24 hrs:  BP Temp Temp src Pulse Resp SpO2 Height Weight  09/27/22 1643 118/70 -- -- (!) 58 -- -- -- --  09/27/22  1611 109/67 -- -- 63 -- -- -- --  09/27/22 1521 117/68 98.6 F (37 C) Oral 73 18 99 % 5\' 2"  (1.575 m) 64.8 kg   Constitutional: Well-developed, well-nourished female in no acute distress.  Cardiovascular: normal rate Respiratory: normal effort GI: Abd soft, non-tender. Pos BS x 4 MS: Extremities nontender, no edema, normal ROM Neurologic: Alert and oriented x 4.  GU: Neg CVAT. Cervix: fingertip/thick/ballotable. Firm/posterior  FHT:  Baseline 125 , moderate variability, accelerations present, no decelerations Contractions: q 6 mins  LAB RESULTS Results for orders placed or performed during the hospital encounter of 09/27/22 (from the past 24 hour(s))  Wet prep,  genital     Status: None   Collection Time: 09/27/22  3:52 PM  Result Value Ref Range   Yeast Wet Prep HPF POC NONE SEEN NONE SEEN   Trich, Wet Prep NONE SEEN NONE SEEN   Clue Cells Wet Prep HPF POC NONE SEEN NONE SEEN   WBC, Wet Prep HPF POC <10 <10   Sperm NONE SEEN   Fern Test     Status: Normal   Collection Time: 09/27/22  4:05 PM  Result Value Ref Range   POCT Fern Test Negative = intact amniotic membranes     O/Positive/-- (05/07 1024)  IMAGING No results found.  MAU Management/MDM: Orders Placed This Encounter  Procedures   Wet prep, genital   Urinalysis, Routine w reflex microscopic -Urine, Clean Catch   Fern Test   Discharge patient Discharge disposition: 01-Home or Self Care; Discharge patient date: 09/27/2022    Meds ordered this encounter  Medications   NIFEdipine (PROCARDIA) capsule 10 mg   NIFEdipine (PROCARDIA) 10 MG capsule    Sig: Take 1-2 capsules (10-20 mg total) by mouth every 4 (four) hours as needed (contractions).    Dispense:  60 capsule    Refill:  1    Patient's cramping/pressure resolved with oral hydration and Procardia. Did not recheck cervix as contractions resolved with above measures. No BV, yeast infections. GC/C and UA pending at discharge. Fern negative, no fluid leaking. Discussed dehydration/stress being a common cause for preterm contractions that are not labor. Advised staying hydrated, resting, taking a bath if feeling contractions and to take procardia as needed. Preterm labor precautions discussed and return precautions given.  ASSESSMENT 1. False labor   2. [redacted] weeks gestation of pregnancy   3. Dehydration     PLAN Discharge home with strict return precautions. Allergies as of 09/27/2022       Reactions   Sulfa Antibiotics Hives        Medication List     TAKE these medications    acetaminophen 500 MG tablet Commonly known as: TYLENOL Take 1,000 mg by mouth every 6 (six) hours as needed for moderate pain or  headache.   Blood Pressure Monitoring Devi 1 each by Does not apply route once a week.   NIFEdipine 10 MG capsule Commonly known as: PROCARDIA Take 1-2 capsules (10-20 mg total) by mouth every 4 (four) hours as needed (contractions).   URINARY HEALTH/CRANBERRY PO Take 4 tablets by mouth daily.   Vitafol Gummies 3.33-0.333-34.8 MG Chew Chew 1 tablet by mouth daily.         Wylene Simmer, MD OB Fellow 09/27/2022  5:53 PM

## 2022-10-03 LAB — GC/CHLAMYDIA PROBE AMP (~~LOC~~) NOT AT ARMC
Chlamydia: NEGATIVE
Comment: NEGATIVE
Comment: NORMAL
Neisseria Gonorrhea: NEGATIVE

## 2022-10-21 ENCOUNTER — Ambulatory Visit (INDEPENDENT_AMBULATORY_CARE_PROVIDER_SITE_OTHER): Payer: Medicaid Other | Admitting: Obstetrics and Gynecology

## 2022-10-21 ENCOUNTER — Other Ambulatory Visit: Payer: Self-pay

## 2022-10-21 ENCOUNTER — Other Ambulatory Visit (HOSPITAL_COMMUNITY)
Admission: RE | Admit: 2022-10-21 | Discharge: 2022-10-21 | Disposition: A | Discharge status: Home / Self Care | Payer: Medicaid Other | Source: Ambulatory Visit | Attending: Obstetrics and Gynecology | Admitting: Obstetrics and Gynecology

## 2022-10-21 VITALS — BP 117/73 | HR 96 | Wt 146.6 lb

## 2022-10-21 DIAGNOSIS — Z3480 Encounter for supervision of other normal pregnancy, unspecified trimester: Secondary | ICD-10-CM

## 2022-10-21 DIAGNOSIS — Z3483 Encounter for supervision of other normal pregnancy, third trimester: Secondary | ICD-10-CM

## 2022-10-21 DIAGNOSIS — Z3A36 36 weeks gestation of pregnancy: Secondary | ICD-10-CM

## 2022-10-21 NOTE — Progress Notes (Signed)
   PRENATAL VISIT NOTE  Subjective:  Karen Rojas is a 27 y.o. G2P1001 at [redacted]w[redacted]d being seen today for ongoing prenatal care.  She is currently monitored for the following issues for this low-risk pregnancy and has Supervision of other normal pregnancy, antepartum; Alpha thalassemia silent carrier; and Carrier of spinal muscular atrophy on their problem list.  Patient reports no complaints.  Contractions: Irritability. Vag. Bleeding: None.  Movement: Present. Denies leaking of fluid.   The following portions of the patient's history were reviewed and updated as appropriate: allergies, current medications, past family history, past medical history, past social history, past surgical history and problem list.   Objective:   Vitals:   10/21/22 1421  BP: 117/73  Pulse: 96  Weight: 146 lb 9.6 oz (66.5 kg)    Fetal Status: Fetal Heart Rate (bpm): 131 Fundal Height: 36 cm Movement: Present  Presentation: Vertex (confirmed with bedside u/s)  General:  Alert, oriented and cooperative. Patient is in no acute distress.  Skin: Skin is warm and dry. No rash noted.   Cardiovascular: Normal heart rate noted  Respiratory: Normal respiratory effort, no problems with respiration noted  Abdomen: Soft, gravid, appropriate for gestational age.  Pain/Pressure: Present     Pelvic: Cervical exam deferred        Extremities: Normal range of motion.  Edema: None  Mental Status: Normal mood and affect. Normal behavior. Normal judgment and thought content.   Assessment and Plan:  Pregnancy: G2P1001 at [redacted]w[redacted]d 1. Supervision of other normal pregnancy, antepartum BP and FHR normal Feeling regular  fetal movement FH appropriate decline tdap, flu, rsv - GC/Chlamydia probe amp ([place])not at [facility] - Culture, beta strep (group b only)  2. [redacted] weeks gestation of pregnancy Swabs collected today   Preterm labor symptoms and general obstetric precautions including but not limited to vaginal bleeding,  contractions, leaking of fluid and fetal movement were reviewed in detail with the patient. Please refer to After Visit Summary for other counseling recommendations.   Return in 1 week for routine prenatal   Albertine Grates, FNP

## 2022-10-23 LAB — GC/CHLAMYDIA PROBE AMP (~~LOC~~) NOT AT ARMC
Chlamydia: NEGATIVE
Comment: NEGATIVE
Comment: NORMAL
Neisseria Gonorrhea: NEGATIVE

## 2022-10-25 ENCOUNTER — Encounter: Payer: Medicaid Other | Admitting: Obstetrics and Gynecology

## 2022-10-25 LAB — CULTURE, BETA STREP (GROUP B ONLY): Strep Gp B Culture: NEGATIVE

## 2022-10-31 ENCOUNTER — Encounter: Payer: Medicaid Other | Admitting: Obstetrics and Gynecology

## 2022-10-31 ENCOUNTER — Telehealth: Payer: Medicaid Other | Admitting: Obstetrics and Gynecology

## 2022-10-31 DIAGNOSIS — Z3480 Encounter for supervision of other normal pregnancy, unspecified trimester: Secondary | ICD-10-CM

## 2022-10-31 NOTE — Progress Notes (Signed)
Attempted to call patient for second time. Sent to unidentified VM--left message to reach out to office to reschedule appointment.   Maureen Ralphs RN on 10/31/22 at [phone]

## 2022-11-01 ENCOUNTER — Encounter: Payer: Self-pay | Admitting: Obstetrics and Gynecology

## 2022-11-03 ENCOUNTER — Inpatient Hospital Stay (HOSPITAL_COMMUNITY)
Admission: AD | Admit: 2022-11-03 | Discharge: 2022-11-05 | DRG: 807 | Disposition: A | Discharge status: Home / Self Care | Payer: Medicaid Other | Attending: Obstetrics and Gynecology | Admitting: Obstetrics and Gynecology

## 2022-11-03 ENCOUNTER — Other Ambulatory Visit: Payer: Self-pay

## 2022-11-03 ENCOUNTER — Encounter (HOSPITAL_COMMUNITY): Payer: Self-pay | Admitting: Obstetrics & Gynecology

## 2022-11-03 ENCOUNTER — Inpatient Hospital Stay (HOSPITAL_COMMUNITY): Payer: Medicaid Other | Admitting: Anesthesiology

## 2022-11-03 DIAGNOSIS — Z3A38 38 weeks gestation of pregnancy: Secondary | ICD-10-CM

## 2022-11-03 DIAGNOSIS — O1404 Mild to moderate pre-eclampsia, complicating childbirth: Secondary | ICD-10-CM | POA: Diagnosis not present

## 2022-11-03 DIAGNOSIS — Z148 Genetic carrier of other disease: Secondary | ICD-10-CM

## 2022-11-03 DIAGNOSIS — Z841 Family history of disorders of kidney and ureter: Secondary | ICD-10-CM | POA: Diagnosis not present

## 2022-11-03 DIAGNOSIS — O26893 Other specified pregnancy related conditions, third trimester: Secondary | ICD-10-CM | POA: Diagnosis present

## 2022-11-03 DIAGNOSIS — D563 Thalassemia minor: Secondary | ICD-10-CM

## 2022-11-03 DIAGNOSIS — Z87891 Personal history of nicotine dependence: Secondary | ICD-10-CM | POA: Diagnosis not present

## 2022-11-03 DIAGNOSIS — Z3480 Encounter for supervision of other normal pregnancy, unspecified trimester: Principal | ICD-10-CM

## 2022-11-03 LAB — CBC
HCT: 30.2 % — ABNORMAL LOW (ref 36.0–46.0)
Hemoglobin: 9.3 g/dL — ABNORMAL LOW (ref 12.0–15.0)
MCH: 23.6 pg — ABNORMAL LOW (ref 26.0–34.0)
MCHC: 30.8 g/dL (ref 30.0–36.0)
MCV: 76.6 fL — ABNORMAL LOW (ref 80.0–100.0)
Platelets: 226 10*3/uL (ref 150–400)
RBC: 3.94 MIL/uL (ref 3.87–5.11)
RDW: 13.6 % (ref 11.5–15.5)
WBC: 8.8 10*3/uL (ref 4.0–10.5)
nRBC: 0 % (ref 0.0–0.2)

## 2022-11-03 LAB — TYPE AND SCREEN
ABO/RH(D): O POS
Antibody Screen: NEGATIVE

## 2022-11-03 LAB — POCT FERN TEST: POCT Fern Test: POSITIVE

## 2022-11-03 MED ORDER — SIMETHICONE 80 MG PO CHEW: 80.0000 mg | CHEWABLE_TABLET | ORAL | Status: DC | PRN

## 2022-11-03 MED ORDER — OXYCODONE-ACETAMINOPHEN 5-325 MG PO TABS: 1.0000 | ORAL_TABLET | ORAL | Status: DC | PRN

## 2022-11-03 MED ORDER — DIPHENHYDRAMINE HCL 25 MG PO CAPS: 25.0000 mg | ORAL_CAPSULE | Freq: Four times a day (QID) | ORAL | Status: DC | PRN

## 2022-11-03 MED ORDER — FENTANYL-BUPIVACAINE-NACL 0.5-0.125-0.9 MG/250ML-% EP SOLN
12.0000 mL/h | EPIDURAL | Status: DC | PRN
  Administered 2022-11-03: 12 mL/h via EPIDURAL
  Filled 2022-11-03: qty 250

## 2022-11-03 MED ORDER — EPHEDRINE 5 MG/ML INJ: 10.0000 mg | INTRAVENOUS | Status: DC | PRN

## 2022-11-03 MED ORDER — ONDANSETRON HCL 4 MG/2ML IJ SOLN: 4.0000 mg | INTRAMUSCULAR | Status: DC | PRN

## 2022-11-03 MED ORDER — PHENYLEPHRINE 80 MCG/ML (10ML) SYRINGE FOR IV PUSH (FOR BLOOD PRESSURE SUPPORT)
80.0000 ug | PREFILLED_SYRINGE | INTRAVENOUS | Status: DC | PRN
  Filled 2022-11-03: qty 10

## 2022-11-03 MED ORDER — DIPHENHYDRAMINE HCL 50 MG/ML IJ SOLN: 12.5000 mg | INTRAMUSCULAR | Status: DC | PRN

## 2022-11-03 MED ORDER — BENZOCAINE-MENTHOL 20-0.5 % EX AERO
1.0000 | INHALATION_SPRAY | CUTANEOUS | Status: DC | PRN
  Administered 2022-11-04: 1 via TOPICAL
  Filled 2022-11-03: qty 56

## 2022-11-03 MED ORDER — TETANUS-DIPHTH-ACELL PERTUSSIS 5-2.5-18.5 LF-MCG/0.5 IM SUSY: 0.5000 mL | PREFILLED_SYRINGE | Freq: Once | INTRAMUSCULAR | Status: DC

## 2022-11-03 MED ORDER — ACETAMINOPHEN 325 MG PO TABS
650.0000 mg | ORAL_TABLET | ORAL | Status: DC | PRN
  Administered 2022-11-04 (×3): 650 mg via ORAL
  Filled 2022-11-03 (×3): qty 2

## 2022-11-03 MED ORDER — ACETAMINOPHEN 325 MG PO TABS: 650.0000 mg | ORAL_TABLET | ORAL | Status: DC | PRN

## 2022-11-03 MED ORDER — OXYTOCIN-SODIUM CHLORIDE 30-0.9 UT/500ML-% IV SOLN
2.5000 [IU]/h | INTRAVENOUS | Status: DC
  Filled 2022-11-03: qty 500

## 2022-11-03 MED ORDER — SODIUM CHLORIDE 0.9 % IV SOLN: 250.0000 mL | INTRAVENOUS | Status: DC | PRN

## 2022-11-03 MED ORDER — IBUPROFEN 800 MG PO TABS
800.0000 mg | ORAL_TABLET | Freq: Three times a day (TID) | ORAL | Status: DC
  Administered 2022-11-04 – 2022-11-05 (×5): 800 mg via ORAL
  Filled 2022-11-03 (×5): qty 1

## 2022-11-03 MED ORDER — LACTATED RINGERS IV SOLN: 500.0000 mL | INTRAVENOUS | Status: DC | PRN

## 2022-11-03 MED ORDER — MEASLES, MUMPS & RUBELLA VAC IJ SOLR: 0.5000 mL | Freq: Once | INTRAMUSCULAR | Status: DC

## 2022-11-03 MED ORDER — OXYCODONE-ACETAMINOPHEN 5-325 MG PO TABS: 2.0000 | ORAL_TABLET | ORAL | Status: DC | PRN

## 2022-11-03 MED ORDER — LIDOCAINE HCL (PF) 1 % IJ SOLN
INTRAMUSCULAR | Status: DC | PRN
  Administered 2022-11-03 (×2): 4 mL via EPIDURAL

## 2022-11-03 MED ORDER — SODIUM CHLORIDE 0.9% FLUSH
3.0000 mL | Freq: Two times a day (BID) | INTRAVENOUS | Status: DC
  Administered 2022-11-04 (×2): 3 mL via INTRAVENOUS

## 2022-11-03 MED ORDER — SOD CITRATE-CITRIC ACID 500-334 MG/5ML PO SOLN: 30.0000 mL | ORAL | Status: DC | PRN

## 2022-11-03 MED ORDER — OXYTOCIN BOLUS FROM INFUSION
333.0000 mL | Freq: Once | INTRAVENOUS | Status: AC
  Administered 2022-11-03: 333 mL via INTRAVENOUS

## 2022-11-03 MED ORDER — ONDANSETRON HCL 4 MG/2ML IJ SOLN: 4.0000 mg | Freq: Four times a day (QID) | INTRAMUSCULAR | Status: DC | PRN

## 2022-11-03 MED ORDER — SENNOSIDES-DOCUSATE SODIUM 8.6-50 MG PO TABS
2.0000 | ORAL_TABLET | ORAL | Status: DC
  Administered 2022-11-04 – 2022-11-05 (×2): 2 via ORAL
  Filled 2022-11-03 (×2): qty 2

## 2022-11-03 MED ORDER — LACTATED RINGERS IV SOLN: INTRAVENOUS | Status: DC

## 2022-11-03 MED ORDER — SODIUM CHLORIDE 0.9% FLUSH: 3.0000 mL | INTRAVENOUS | Status: DC | PRN

## 2022-11-03 MED ORDER — LIDOCAINE HCL (PF) 1 % IJ SOLN: 30.0000 mL | INTRAMUSCULAR | Status: DC | PRN

## 2022-11-03 MED ORDER — COCONUT OIL OIL
1.0000 | TOPICAL_OIL | Status: DC | PRN
  Administered 2022-11-04: 1 via TOPICAL

## 2022-11-03 MED ORDER — DIBUCAINE (PERIANAL) 1 % EX OINT: 1.0000 | TOPICAL_OINTMENT | CUTANEOUS | Status: DC | PRN

## 2022-11-03 MED ORDER — ONDANSETRON HCL 4 MG PO TABS: 4.0000 mg | ORAL_TABLET | ORAL | Status: DC | PRN

## 2022-11-03 MED ORDER — PRENATAL MULTIVITAMIN CH
1.0000 | ORAL_TABLET | Freq: Every day | ORAL | Status: DC
  Administered 2022-11-04 – 2022-11-05 (×2): 1 via ORAL
  Filled 2022-11-03 (×2): qty 1

## 2022-11-03 MED ORDER — WITCH HAZEL-GLYCERIN EX PADS: 1.0000 | MEDICATED_PAD | CUTANEOUS | Status: DC | PRN

## 2022-11-03 MED ORDER — LACTATED RINGERS IV SOLN: 500.0000 mL | Freq: Once | INTRAVENOUS | Status: DC

## 2022-11-03 MED ORDER — PHENYLEPHRINE 80 MCG/ML (10ML) SYRINGE FOR IV PUSH (FOR BLOOD PRESSURE SUPPORT): 80.0000 ug | PREFILLED_SYRINGE | INTRAVENOUS | Status: DC | PRN

## 2022-11-03 MED ORDER — FENTANYL CITRATE (PF) 100 MCG/2ML IJ SOLN
100.0000 ug | INTRAMUSCULAR | Status: DC | PRN
  Administered 2022-11-03: 100 ug via INTRAVENOUS
  Filled 2022-11-03: qty 2

## 2022-11-03 NOTE — H&P (Addendum)
OBSTETRIC ADMISSION HISTORY AND PHYSICAL  Karen Rojas is a 27 y.o. female G2P1001 with IUP at [redacted]w[redacted]d by LMP presenting for spontaneous labor. She reports +FMs, No LOF, no VB, no blurry vision, headaches or peripheral edema, and RUQ pain.  She plans on breast feeding and bottle feeding. She requests Unsure for birth control. She received her prenatal care at [facility]  Dating: By LMP --->  Estimated Date of Delivery: 11/13/22 Sono:  @[redacted]w[redacted]d , normal anatomy, variable presentation, posterior placenta, 421g, 67% EFW  Prenatal History/Complications: Mild preeclampsia and anemia with first pregnancy Otherwise unremarkable  Past Medical History: Past Medical History:  Diagnosis Date   Asthma    years since last used inhaler   Urinary tract bacterial infections     Past Surgical History: Past Surgical History:  Procedure Laterality Date   NO PAST SURGERIES     Obstetrical History: OB History     Gravida  2   Para  1   Term  1   Preterm  0   AB  0   Living  1      SAB  0   IAB  0   Ectopic  0   Multiple  0   Live Births  1          Social History Social History   Socioeconomic History   Marital status: Single    Spouse name: Not on file   Number of children: Not on file   Years of education: Not on file   Highest education level: High school graduate  Occupational History   Not on file  Tobacco Use   Smoking status: Former    Current packs/day: 0.00    Types: Cigarettes    Quit date: 2019    Years since quitting: 5.7   Smokeless tobacco: Never  Vaping Use   Vaping status: Never Used  Substance and Sexual Activity   Alcohol use: Not Currently    Alcohol/week: 0.0 standard drinks of alcohol   Drug use: Not Currently    Types: Marijuana    Comment: occasional been a couple of years   Sexual activity: Yes    Partners: Male    Birth control/protection: None  Other Topics Concern   Not on file  Social History Narrative   Not on file    Social Determinants of Health   Financial Resource Strain: Not on file  Food Insecurity: No Food Insecurity (06/04/2022)   Hunger Vital Sign    Worried About Running Out of Food in the Last Year: Never true    Ran Out of Food in the Last Year: Never true  Transportation Needs: No Transportation Needs (06/04/2022)   PRAPARE - Administrator, Civil Service (Medical): No    Lack of Transportation (Non-Medical): No  Physical Activity: Not on file  Stress: Not on file  Social Connections: Not on file    Family History: Family History  Problem Relation Age of Onset   Kidney disease Mother     Allergies: Allergies  Allergen Reactions   Sulfa Antibiotics Hives    Medications Prior to Admission  Medication Sig Dispense Refill Last Dose   Blood Pressure Monitoring DEVI 1 each by Does not apply route once a week. 1 each 0    NIFEdipine (PROCARDIA) 10 MG capsule Take 1-2 capsules (10-20 mg total) by mouth every 4 (four) hours as needed (contractions). (Patient not taking: Reported on 10/21/2022) 60 capsule 1    Prenatal Vit-Fe Phos-FA-Omega (  VITAFOL GUMMIES) 3.33-0.333-34.8 MG CHEW Chew 1 tablet by mouth daily. 90 tablet 4     Review of Systems  All systems reviewed and negative except as stated in HPI  Last menstrual period 02/06/2022, currently breastfeeding. General appearance: alert, cooperative, and mild distress Lungs: clear to auscultation bilaterally Heart: regular rate and rhythm Abdomen: soft, non-tender; bowel sounds normal Pelvic: No leaking fluid, cervical exam below. Extremities: Homans sign is negative, no sign of DVT DTRs: 2+ brachial and patellar Presentation: cephalic Fetal monitoring: Baseline: 140 bpm, Variability: Good {> 6 bpm), Accelerations: Reactive, and Decelerations: Absent Uterine activity: Every 2 minutes  Dilation: 1.5 Effacement (%): Thick Cervical Position: Posterior Presentation: Undeterminable Exam by:: jessica hashem  rn  Prenatal labs: ABO, Rh: O/Positive/-- (05/07 1024) Antibody: Negative (05/07 1024) Rubella: 2.75 (05/07 1024) RPR: Non Reactive (07/31 1023)  HBsAg: Negative (05/07 1024)  HIV: Non Reactive (07/31 1023)  GBS: Negative/-- (09/23 0320)  GTT: Normal 3rd trimester Genetic screening: LR NIPS, negative AFP Anatomy US: Normal  NURSING  PROVIDER  Conservator, museum/gallery for Women Dating by LMP  [facility] Model Traditional Anatomy U/S    Initiated care at  [employer]  English               LAB RESULTS   Support Person Malachi(FOB) Genetics NIPS:     LR female        AFP:  negative                NT/IT (FT only)        Carrier Screen Horizon: Silent carrier alpha thal and SMA [ ] partner testing  Rhogam  O/Positive/-- (05/07 1024) A1C/GTT Early:             Third trimester: normal  Flu Vaccine Declined       TDaP Vaccine Declined Blood Type O/Positive/-- (05/07 1024)  Covid Vaccine   Antibody Negative (05/07 1024)      Rubella 2.75 (05/07 1024)  Feeding Plan Breast & Bottle RPR Non Reactive (07/31 1023)  Contraception Undecided HBsAg Negative (05/07 1024)  Circumcision Yes HIV Non Reactive (07/31 1023)  Pediatrician  [facility] HCVAb Non Reactive (05/07 1024)  Prenatal Classes            Pap       Diagnosis  Date Value Ref Range Status  06/17/2019     Final    - Negative for intraepithelial lesion or malignancy (NILM)    BTLConsent NA GC/CT Initial:             36wks:  VBAC  Consent NA GBS For PCN allergy, check sensitivities            DME Rx [ X] BP cuff [ ]  Weight Scale Waterbirth  [ ]  Class [ ]  Consent [ ]  CNM visit  PHQ9 & GAD7 [  ] new OB [  ] 28 weeks  [  ] 36 weeks Induction  [ ]  Orders Entered [ ] Foley Y/N    Prenatal Transfer Tool  Maternal Diabetes: No Genetic Screening: Normal Maternal Ultrasounds/Referrals: Normal Fetal Ultrasounds or other Referrals:  None Maternal Substance Abuse:  No Significant Maternal Medications:   None Significant Maternal Lab Results: Group B Strep negative  Results for orders placed or performed during the hospital encounter of 11/03/22 (from the past 24 hour(s))  Fern Test   Collection Time: 11/03/22  5:41  PM  Result Value Ref Range   POCT Fern Test Positive = ruptured amniotic membanes     Patient Active Problem List   Diagnosis Date Noted   Alpha thalassemia silent carrier 06/04/2022   Carrier of spinal muscular atrophy 06/04/2022   Supervision of other normal pregnancy, antepartum 05/28/2022    Assessment/Plan:  Victorya Hillman is a 27 y.o. G2P1001 at [redacted]w[redacted]d here for spontaneous labor.  #Labor: SROM in MAU with moderate meconium, now laboring.  Will recheck progress and consider augmentation. #Pain: IV fentanyl -> Epidural at patient's request, she plans to wait a bit #Fetal Well Being: Cat I #ID: Group B Strep negative #Method Of Feeding: breast feeding and bottle feeding #Method Of Contraception:  Unsure #Circ: N/A  [facility] doctor, MD  Attestation of Supervision of Resident:  I confirm that I have verified the information documented in the resident's note and that I have also personally reperformed the history, physical exam and all medical decision making activities.  I have verified that all services and findings are accurately documented in this resident's note; and I agree with management and plan as outlined in the documentation. I have also made any necessary editorial changes.  Sundra Aland, MD OB Fellow, Faculty Practice [facility], Center for [facility]

## 2022-11-03 NOTE — Progress Notes (Signed)
Delivery Note At 8:59 PM a viable female was delivered via Vaginal, Spontaneous (Presentation: Middle Occiput Anterior).  APGAR: 8, 9; weight: As recorded. Delayed cord clamp. Cord cut by FOB.    Placenta status: Spontaneous, Intact.  Cord: 3 vessels. No lacerations or tears noted  Anesthesia: Epidural Episiotomy: None Lacerations: None  Est. Blood Loss (mL): 300  Mom to postpartum.  Baby to Couplet care / Skin to Skin.  Hermina Staggers 11/03/2022, 9:43 PM

## 2022-11-03 NOTE — Discharge Summary (Signed)
Postpartum Discharge Summary  Date of Service updated***     Patient Name: Karen Rojas DOB: 10/18/1995 MRN: [id number]  Date of admission: 11/03/2022 Delivery date:11/03/2022 Delivering provider: Hermina Staggers Date of discharge: 11/03/2022  Admitting diagnosis: Normal labor [O80, Z37.9] Intrauterine pregnancy: [redacted]w[redacted]d     Secondary diagnosis:  Principal Problem:   SVD (spontaneous vaginal delivery) Active Problems:   Normal labor  Additional problems: ***    Discharge diagnosis: Term Pregnancy Delivered and ***                                              Post partum procedures:{Postpartum procedures:23558} Augmentation: N/A Complications: {OB Labor/Delivery Complications:20784}  Hospital course: Onset of Labor With Vaginal Delivery      27 y.o. yo G2P1001 at [redacted]w[redacted]d was admitted in Latent Labor on 11/03/2022. Labor course was uncomplicated.  Membrane Rupture Time/Date: 5:30 PM,11/03/2022  Delivery Method:Vaginal, Spontaneous Operative Delivery:N/A Episiotomy: None Lacerations:  None Patient had a postpartum course complicated by ***.  She is ambulating, tolerating a regular diet, passing flatus, and urinating well. Patient is discharged home in stable condition on 11/03/22.  Newborn Data: Birth date:11/03/2022 Birth time:8:59 PM Gender:Female Living status:Living Apgars:8 ,9  Weight:   Magnesium Sulfate received: No BMZ received: No Rhophylac:N/A MMR:N/A T-DaP:{Tdap:23962} Flu: {NWG:95621} RSV Vaccine received: {RSV:31013} Transfusion:{Transfusion received:30440034}  Immunizations received: There is no immunization history for the selected administration types on file for this patient.  Physical exam  Vitals:   11/03/22 2015 11/03/22 2020 11/03/22 2030 11/03/22 2115  BP: 124/87 (!) 140/81 130/72 120/77  Pulse: 69 65 74 75  Resp:      Temp:      TempSrc:      SpO2: 98% 98% 97%    General: {Exam; general:21111117} Lochia: {Desc;  appropriate/inappropriate:30686::"appropriate"} Uterine Fundus: {Desc; firm/soft:30687} Incision: {Exam; incision:21111123} DVT Evaluation: {Exam; dvt:2111122} Labs: Lab Results  Component Value Date   WBC 8.8 11/03/2022   HGB 9.3 (L) 11/03/2022   HCT 30.2 (L) 11/03/2022   MCV 76.6 (L) 11/03/2022   PLT 226 11/03/2022      Latest Ref Rng & Units 04/16/2021    2:17 AM  CMP  Glucose 70 - 99 mg/dL 91   BUN 6 - 20 mg/dL 14   Creatinine 3.08 - 1.00 mg/dL 6.57   Sodium 846 - 962 mmol/L 137   Potassium 3.5 - 5.1 mmol/L 3.9   Chloride 98 - 111 mmol/L 106   CO2 22 - 32 mmol/L 24   Calcium 8.9 - 10.3 mg/dL 9.2   Total Protein 6.5 - 8.1 g/dL 6.9   Total Bilirubin 0.3 - 1.2 mg/dL 0.7   Alkaline Phos 38 - 126 U/L 75   AST 15 - 41 U/L 18   ALT 0 - 44 U/L 13    Edinburgh Score:    02/17/2020   10:29 AM  Edinburgh Postnatal Depression Scale Screening Tool  I have been able to laugh and see the funny side of things. 0  I have looked forward with enjoyment to things. 0  I have blamed myself unnecessarily when things went wrong. 0  I have been anxious or worried for no good reason. 0  I have felt scared or panicky for no good reason. 0  Things have been getting on top of me. 0  I have been so unhappy that I  have had difficulty sleeping. 0  I have felt sad or miserable. 0  I have been so unhappy that I have been crying. 0  The thought of harming myself has occurred to me. 0  Edinburgh Postnatal Depression Scale Total 0   No data recorded  After visit meds:  Allergies as of 11/03/2022       Reactions   Sulfa Antibiotics Hives     Med Rec must be completed prior to using this [facility]***        Discharge home in stable condition Infant Feeding: {Baby feeding:23562} Infant Disposition:{CHL IP OB HOME WITH KVQQVZ:56387} Discharge instruction: per After Visit Summary and Postpartum booklet. Activity: Advance as tolerated. Pelvic rest for 6 weeks.  Diet: {OB  FIEP:32951884} Future Appointments:No future appointments. Follow up Visit: Message sent to [facility] 10/6  Please schedule this patient for a In person postpartum visit in 4 weeks with the following provider: Any provider. Additional Postpartum F/U: n/a   Low risk pregnancy complicated by:  h/o pre-e  Delivery mode:  Vaginal, Spontaneous Anticipated Birth Control:  Unsure   11/03/2022 Sundra Aland, MD

## 2022-11-03 NOTE — MAU Note (Signed)
.  Karen Rojas is a 27 y.o. at [redacted]w[redacted]d here in MAU reporting: ctx q 3-6 min since 0600 today. Denies LOF reports some mucous like dc.    Onset of complaint: 0600 Pain score: 6 There were no vitals filed for this visit.   FHT: 130

## 2022-11-03 NOTE — Anesthesia Procedure Notes (Signed)
Epidural Patient location during procedure: OB Start time: 11/03/2022 7:34 PM End time: 11/03/2022 7:39 PM  Staffing Anesthesiologist: Linton Rump, MD Performed: anesthesiologist   Preanesthetic Checklist Completed: patient identified, IV checked, site marked, risks and benefits discussed, surgical consent, monitors and equipment checked, pre-op evaluation and timeout performed  Epidural Patient position: sitting Prep: DuraPrep and site prepped and draped Patient monitoring: continuous pulse ox and blood pressure Approach: midline Location: L3-L4 Injection technique: LOR saline  Needle:  Needle type: Tuohy  Needle gauge: 17 G Needle length: 9 cm and 9 Needle insertion depth: 4.5 cm Catheter type: closed end flexible Catheter size: 19 Gauge Catheter at skin depth: 9 cm Test dose: negative  Assessment Events: blood not aspirated, no cerebrospinal fluid, injection not painful, no injection resistance, no paresthesia and negative IV test  Additional Notes The patient has requested an epidural for labor pain management. Risks and benefits including, but not limited to, infection, bleeding, local anesthetic toxicity, headache, hypotension, back pain, block failure, etc. were discussed with the patient. The patient expressed understanding and consented to the procedure. I confirmed that the patient has no bleeding disorders and is not taking blood thinners. I confirmed the patient's last platelet count with the nurse. A time-out was performed immediately prior to the procedure. Please see nursing documentation for vital signs. Sterile technique was used throughout the whole procedure. Once LOR achieved, the epidural catheter threaded easily without resistance. Aspiration of the catheter was negative for blood and CSF. The epidural was dosed slowly and an infusion was started.  1 attempt(s)Reason for block:procedure for pain

## 2022-11-03 NOTE — Anesthesia Preprocedure Evaluation (Signed)
Anesthesia Evaluation  Patient identified by MRN, date of birth, ID band Patient awake    Reviewed: Allergy & Precautions, NPO status , Patient's Chart, lab work & pertinent test results  History of Anesthesia Complications Negative for: history of anesthetic complications  Airway Mallampati: III  TM Distance: >3 FB Neck ROM: Full    Dental   Pulmonary Shortness of breath: childhood., asthma , former smoker   Pulmonary exam normal breath sounds clear to auscultation       Cardiovascular negative cardio ROS  Rhythm:Regular Rate:Normal     Neuro/Psych    GI/Hepatic negative GI ROS,,,  Endo/Other  negative endocrine ROS    Renal/GU negative Renal ROS     Musculoskeletal   Abdominal   Peds  Hematology  (+) Blood dyscrasia (alpha thalassemia silent carrier) Lab Results      Component                Value               Date                      WBC                      8.8                 11/03/2022                HGB                      9.3 (L)             11/03/2022                HCT                      30.2 (L)            11/03/2022                MCV                      76.6 (L)            11/03/2022                PLT                      226                 11/03/2022              Anesthesia Other Findings   Reproductive/Obstetrics (+) Pregnancy                              Anesthesia Physical Anesthesia Plan  ASA: 2  Anesthesia Plan: Epidural   Post-op Pain Management:    Induction:   PONV Risk Score and Plan:   Airway Management Planned:   Additional Equipment:   Intra-op Plan:   Post-operative Plan:   Informed Consent: I have reviewed the patients History and Physical, chart, labs and discussed the procedure including the risks, benefits and alternatives for the proposed anesthesia with the patient or authorized representative who has indicated his/her  understanding and acceptance.       Plan Discussed with: Anesthesiologist  Anesthesia Plan Comments: (I have  discussed risks of neuraxial anesthesia including but not limited to infection, bleeding, nerve injury, back pain, headache, seizures, and failure of block. Patient denies bleeding disorders and is not currently anticoagulated. Labs have been reviewed. Risks and benefits discussed. All patient's questions answered.  )         Anesthesia Quick Evaluation

## 2022-11-03 NOTE — MAU Note (Signed)
Vertex presentation confirmed via bs [place] by cnm Dorathy Kinsman

## 2022-11-04 LAB — RPR: RPR Ser Ql: NONREACTIVE

## 2022-11-04 NOTE — Progress Notes (Signed)
POSTPARTUM PROGRESS NOTE  PPD#1  Subjective:  Karen Rojas is a 27 y.o. [id number] s/p NSVD at [redacted]w[redacted]d. Today she notes no acute complaints. She denies any problems with ambulating, voiding or po intake. Denies nausea or vomiting. She has passed flatus, no BM.  Pain is well controlled.  Lochia minimal Denies fever/chills/chest pain/SOB.   Objective: Blood pressure 121/72, pulse 81, temperature 98.2 F (36.8 C), temperature source Oral, resp. rate 16, last menstrual period 02/06/2022, SpO2 99%, unknown if currently breastfeeding.  Physical Exam:  General: alert, cooperative and no distress Chest: no respiratory distress Heart: regular rate and rhythm Abdomen: soft, nontender Uterine Fundus: firm, appropriately tender Incision: NA DVT Evaluation: No calf swelling or tenderness Extremities: no edema Skin: warm, dry  Results for orders placed or performed during the hospital encounter of 11/03/22 (from the past 24 hour(s))  Fern Test     Status: Abnormal   Collection Time: 11/03/22  5:41 PM  Result Value Ref Range   POCT Fern Test Positive = ruptured amniotic membanes   CBC     Status: Abnormal   Collection Time: 11/03/22  6:02 PM  Result Value Ref Range   WBC 8.8 4.0 - 10.5 K/uL   RBC 3.94 3.87 - 5.11 MIL/uL   Hemoglobin 9.3 (L) 12.0 - 15.0 g/dL   HCT 45.4 (L) 09.8 - 11.9 %   MCV 76.6 (L) 80.0 - 100.0 fL   MCH 23.6 (L) 26.0 - 34.0 pg   MCHC 30.8 30.0 - 36.0 g/dL   RDW 14.7 82.9 - 56.2 %   Platelets 226 150 - 400 K/uL   nRBC 0.0 0.0 - 0.2 %  Type and screen [facility]     Status: None   Collection Time: 11/03/22  6:02 PM  Result Value Ref Range   ABO/RH(D) O POS    Antibody Screen NEG    Sample Expiration      11/06/2022,2359 Performed at [facility], [address], [place], [place] [address]   RPR     Status: None   Collection Time: 11/03/22  6:02 PM  Result Value Ref Range   RPR Ser Ql NON REACTIVE NON REACTIVE     Assessment/Plan: Karen Rojas is a 27 y.o. [id number] s/p NSVD at [redacted]w[redacted]d PPD#1 -pain well controlled -continue postpartum care  Contraception: not sure Feeding: breast/bottle  Dispo: Plan for discharge home tomorrow   LOS: 1 day   Myna Hidalgo, DO Faculty Attending, Center for [facility] 11/04/2022, 11:21 AM

## 2022-11-04 NOTE — Lactation Note (Signed)
This note was copied from a baby's chart. Lactation Consultation Note  Patient Name: Karen Rojas WGNFA'O Date: 11/04/2022 Age:27 hours Reason for consult: Initial assessment;Early term 37-38.6wks  P2, Mother experienced with breastfeeding and is resting so LC will follow up later today.  Maternal Data Has patient been taught Hand Expression?: Yes Does the patient have breastfeeding experience prior to this delivery?: Yes How long did the patient breastfeed?: 2 years  Feeding Mother's Current Feeding Choice: Breast Milk Interventions Interventions: LC Services brochure Consult Status Consult Status: Follow-up Date: 11/05/22 Follow-up type: In-patient    Dahlia Byes [facility] 11/04/2022, 8:39 AM

## 2022-11-04 NOTE — Anesthesia Postprocedure Evaluation (Signed)
Anesthesia Post Note  Patient: Karen Rojas  Procedure(s) Performed: AN AD HOC LABOR EPIDURAL     Patient location during evaluation: Mother Baby Anesthesia Type: Epidural Level of consciousness: awake Pain management: satisfactory to patient Vital Signs Assessment: post-procedure vital signs reviewed and stable Respiratory status: spontaneous breathing Cardiovascular status: stable Anesthetic complications: no  No notable events documented.  Last Vitals:  Vitals:   11/04/22 0005 11/04/22 0411  BP: 119/82 119/69  Pulse: 76 86  Resp: 18 20  Temp: 37 C 37 C  SpO2: 100% 98%    Last Pain:  Vitals:   11/04/22 0720  TempSrc:   PainSc: 0-No pain   Pain Goal:                   [employer]

## 2022-11-05 ENCOUNTER — Other Ambulatory Visit (HOSPITAL_COMMUNITY): Payer: Self-pay

## 2022-11-05 MED ORDER — VITAFOL GUMMIES 3.33-0.333-34.8 MG PO CHEW
1.0000 | CHEWABLE_TABLET | Freq: Every day | ORAL | 4 refills | Status: AC
  Filled 2022-11-05: qty 90, 90d supply, fill #0

## 2022-11-05 MED ORDER — IBUPROFEN 800 MG PO TABS
800.0000 mg | ORAL_TABLET | Freq: Three times a day (TID) | ORAL | 0 refills | Status: DC
  Filled 2022-11-05: qty 30, 10d supply, fill #0

## 2022-11-05 MED ORDER — FUROSEMIDE 20 MG PO TABS
20.0000 mg | ORAL_TABLET | Freq: Every day | ORAL | 0 refills | Status: DC
Start: 2022-11-05 — End: 2023-10-09
  Filled 2022-11-05: qty 7, 7d supply, fill #0

## 2022-11-05 NOTE — Lactation Note (Signed)
This note was copied from a baby's chart. Lactation Consultation Note  Patient Name: Karen Rojas [id number] Date: 11/05/2022 Age:27 hours Reason for consult: Follow-up assessment  P2, Mother states breastfeeding is going well.  Reviewed engorgement care and monitoring voids/stools.   Maternal Data Has patient been taught Hand Expression?: Yes  Feeding Mother's Current Feeding Choice: Breast Milk  LATCH Score Latch: Grasps breast easily, tongue down, lips flanged, rhythmical sucking.  Audible Swallowing: A few with stimulation  Type of Nipple: Everted at rest and after stimulation  Comfort (Breast/Nipple): Soft / non-tender  Hold (Positioning): No assistance needed to correctly position infant at breast.  LATCH Score: 9   Lactation Tools Discussed/Used  DEBP, if needed to increase mother's supply  Interventions Interventions: Education;LC Services brochure  Discharge Discharge Education: Engorgement and breast care;Warning signs for feeding baby Pump: Personal  Consult Status Consult Status: Complete Date: 11/05/22    Dahlia Byes [facility] 11/05/2022, 11:25 AM

## 2022-11-28 ENCOUNTER — Telehealth (HOSPITAL_COMMUNITY): Payer: Self-pay | Admitting: *Deleted

## 2022-11-28 NOTE — Telephone Encounter (Signed)
11/28/2022  Name: Karen Rojas MRN: [id number] DOB: 08-Feb-1995  Reason for Call:  Transition of Care Hospital Discharge Call  Contact Status: Patient Contact Status: Complete  Language assistant needed: Interpreter Mode: Interpreter Not Needed        Follow-Up Questions: Do You Have Any Concerns About Your Health As You Heal From Delivery?: No Do You Have Any Concerns About Your Infants Health?: No  Edinburgh Postnatal Depression Scale:  In the Past 7 Days:    PHQ2-9 Depression Scale:     Discharge Follow-up: Edinburgh score requires follow up?:  (declines screening today, took with primary care doctor recently with no concerns raised, endorses she is doing well emotionally) Patient was advised of the following resources:: Breastfeeding Support Group, Support Group  Post-discharge interventions: Reviewed Newborn Safe Sleep Practices  Salena Saner, RN 11/28/2022 13:39

## 2022-12-10 NOTE — Progress Notes (Unsigned)
    Post Partum Visit Note  Karen Rojas is a 27 y.o. G81P2002 female who presents for a postpartum visit. She is {1-10:13787} {time; units:18646} postpartum following a {method of delivery:313099}.  I have fully reviewed the prenatal and intrapartum course. The delivery was at *** gestational weeks.  Anesthesia: {anesthesia types:812}. Postpartum course has been ***. Baby is doing well***. Baby is feeding by {breastmilk/bottle:69}. Bleeding {vag bleed:12292}. Bowel function is {normal:32111}. Bladder function is {normal:32111}. Patient {is/is not:9024} sexually active. Contraception method is {contraceptive method:5051}. Postpartum depression screening: {gen negative/positive:315881}.   The pregnancy intention screening data noted above was reviewed. Potential methods of contraception were discussed. The patient elected to proceed with No data recorded.    Health Maintenance Due  Topic Date Due   DTaP/Tdap/Td (1 - Tdap) Never done   Cervical Cancer Screening (Pap smear)  06/17/2022   INFLUENZA VACCINE  Never done   COVID-19 Vaccine (1 - 2023-24 season) Never done    {Common ambulatory SmartLinks:19316}  Review of Systems {ros; complete:30496}  Objective:  LMP 02/06/2022    General:  {gen appearance:16600}   Breasts:  {desc; normal/abnormal/not indicated:14647}  Lungs: {lung exam:16931}  Heart:  {heart exam:5510}  Abdomen: {abdomen exam:16834}   Wound {Wound assessment:11097}  GU exam:  {desc; normal/abnormal/not indicated:14647}       Assessment:    1. Postpartum care and examination ***   *** postpartum exam.   Plan:   Essential components of care per ACOG recommendations:  1.  Mood and well being: Patient with {gen negative/positive:315881} depression screening today. Reviewed local resources for support.  - Patient tobacco use? {tobacco use:25506}  - hx of drug use? {yes/no:25505}    2. Infant care and feeding:  -Patient currently breastmilk feeding?  {yes/no:25502}  -Social determinants of health (SDOH) reviewed in EPIC. No concerns***The following needs were identified***  3. Sexuality, contraception and birth spacing - Patient {DOES_DOES GNF:62130} want a pregnancy in the next year.  Desired family size is {NUMBER 1-10:22536} children.  - Reviewed reproductive life planning. Reviewed contraceptive methods based on pt preferences and effectiveness.  Patient desired {Upstream End Methods:24109} today.   - Discussed birth spacing of 18 months  4. Sleep and fatigue -Encouraged family/partner/community support of 4 hrs of uninterrupted sleep to help with mood and fatigue  5. Physical Recovery  - Discussed patients delivery and complications. She describes her labor as {description:25511} - Patient had a {CHL AMB DELIVERY:3466947565}. Patient had a {laceration:25518} laceration. Perineal healing reviewed. Patient expressed understanding - Patient has urinary incontinence? {yes/no:25515} - Patient {ACTION; IS/IS QMV:78469629} safe to resume physical and sexual activity  6.  Health Maintenance - HM due items addressed {Yes or If no, why not?:20788} - Last pap smear  Diagnosis  Date Value Ref Range Status  06/17/2019   Final   - Negative for intraepithelial lesion or malignancy (NILM)   Pap smear {done:10129} at today's visit.  -Breast Cancer screening indicated? {indicated:25516}  7. Chronic Disease/Pregnancy Condition follow up: {Follow up:25499}  - PCP follow up  Richardson Landry, CNM [facility], [facility]

## 2022-12-11 ENCOUNTER — Ambulatory Visit: Payer: Medicaid Other | Admitting: Certified Nurse Midwife

## 2022-12-11 DIAGNOSIS — Z91199 Patient's noncompliance with other medical treatment and regimen due to unspecified reason: Secondary | ICD-10-CM

## 2023-10-06 ENCOUNTER — Ambulatory Visit

## 2023-10-06 DIAGNOSIS — Z3201 Encounter for pregnancy test, result positive: Secondary | ICD-10-CM | POA: Diagnosis not present

## 2023-10-06 LAB — POCT PREGNANCY, URINE: Preg Test, Ur: POSITIVE — AB

## 2023-10-06 NOTE — Patient Instructions (Signed)
Safe Medications in Pregnancy  ? ?Acne:  ?Benzoyl Peroxide  ?Salicylic Acid  ? ?Backache/Headache:  ?Tylenol: 2 regular strength every 4 hours OR  ?             2 Extra strength every 6 hours  ? ?Colds/Coughs/Allergies:  ?Benadryl (alcohol free) 25 mg every 6 hours as needed  ?Breath right strips  ?Claritin  ?Cepacol throat lozenges  ?Chloraseptic throat spray  ?Cold-Eeze- up to three times per day  ?Cough drops, alcohol free  ?Flonase (by prescription only)  ?Guaifenesin  ?Mucinex  ?Robitussin DM (plain only, alcohol free)  ?Saline nasal spray/drops  ?Sudafed (pseudoephedrine) & Actifed * use only after [redacted] weeks gestation and if you do not have high blood pressure  ?Tylenol  ?Vicks Vaporub  ?Zinc lozenges  ?Zyrtec  ? ?Constipation:  ?Colace  ?Ducolax suppositories  ?Fleet enema  ?Glycerin suppositories  ?Metamucil  ?Milk of magnesia  ?Miralax  ?Senokot  ?Smooth move tea  ? ?Diarrhea:  ?Kaopectate  ?Imodium A-D  ? ?*NO pepto Bismol  ? ?Hemorrhoids:  ?Anusol  ?Anusol HC  ?Preparation H  ?Tucks  ? ?Indigestion:  ?Tums  ?Maalox  ?Mylanta  ?Zantac  ?Pepcid  ? ?Insomnia:  ?Benadryl (alcohol free) 25mg every 6 hours as needed  ?Tylenol PM  ?Unisom, no Gelcaps  ? ?Leg Cramps:  ?Tums  ?MagGel  ? ?Nausea/Vomiting:  ?Bonine  ?Dramamine  ?Emetrol  ?Ginger extract  ?Sea bands  ?Meclizine  ?Nausea medication to take during pregnancy:  ?Unisom (doxylamine succinate 25 mg tablets) Take one tablet daily at bedtime. If symptoms are not adequately controlled, the dose can be increased to a maximum recommended dose of two tablets daily (1/2 tablet in the morning, 1/2 tablet mid-afternoon and one at bedtime).  ?Vitamin B6 100mg tablets. Take one tablet twice a day (up to 200 mg per day).  ? ?Skin Rashes:  ?Aveeno products  ?Benadryl cream or 25mg every 6 hours as needed  ?Calamine Lotion  ?1% cortisone cream  ? ?Yeast infection:  ?Gyne-lotrimin 7  ?Monistat 7  ? ? ?**If taking multiple medications, please check labels to avoid  duplicating the same active ingredients  ?**take medication as directed on the label  ?** Do not exceed 4000 mg of tylenol in 24 hours  ?**Do not take medications that contain aspirin or ibuprofen  ?    ?   ?  ?[facility] Prenatal Care Providers ?         ?[facility] locations:  ?Hours may vary. Please call for an appointment ? ?[facility] at [facility]             ?[address], [place], [place] [address] ?[phone] ? ?[facility] at [facility]                                                             ?[address], [address], [place], [place], [address] ?[phone] ? ?[facility] at [place]                                    ?[address], [address], , [place], [address] ?[phone] ? ?[facility] at [place] ?[address], [address], [place], [place], [address] ?[phone] ? ?[facility] at [facility]                                 ?[address], [place], [place], [address] ?[phone] ? ?[facility] at [facility]                                    ?[address], [place], [place], [address] ?[phone] ? ?[facility] at [facility] ?[address], [address], [place], [place], [address]                             ?  ?

## 2023-10-06 NOTE — Progress Notes (Signed)
 Pt left urine for UPT resulting positive.  Pt reports LMP 06/07/23 but is not sure as she recently delivered in October 2024.  Pt denies VB and pain today. Medications and allergies reviewed.  Pt provided with list of medications that is safe in pregnancy and list of OB providers.  Pt reports that she is taking a PNV.  Pt encouraged to begin prenatal care.  Pt verbalized understanding with no further questions.  Janifer Gieselman,RN  10/06/23

## 2023-10-09 ENCOUNTER — Telehealth (INDEPENDENT_AMBULATORY_CARE_PROVIDER_SITE_OTHER)

## 2023-10-09 DIAGNOSIS — O219 Vomiting of pregnancy, unspecified: Secondary | ICD-10-CM

## 2023-10-09 DIAGNOSIS — Z3490 Encounter for supervision of normal pregnancy, unspecified, unspecified trimester: Secondary | ICD-10-CM | POA: Insufficient documentation

## 2023-10-09 DIAGNOSIS — Z3A17 17 weeks gestation of pregnancy: Secondary | ICD-10-CM

## 2023-10-09 DIAGNOSIS — Z3492 Encounter for supervision of normal pregnancy, unspecified, second trimester: Secondary | ICD-10-CM | POA: Diagnosis not present

## 2023-10-09 NOTE — Patient Instructions (Signed)

## 2023-10-09 NOTE — Progress Notes (Signed)
 New OB Intake  I connected with Karen Rojas  on 10/09/23 at 10:15 AM EDT by MyChart Video Visit and verified that I am speaking with the correct person using two identifiers. Nurse is located at [facility] and pt is located at Home.  I discussed the limitations, risks, security and privacy concerns of performing an evaluation and management service by telephone and the availability of in person appointments. I also discussed with the patient that there may be a patient responsible charge related to this service. The patient expressed understanding and agreed to proceed.  I explained I am completing New OB Intake today. We discussed EDD of 03/13/24 based on unsure LMP of 06/07/23 Dating US  ordered to assess potential change of EDD. Pt is G3P2002. I reviewed her allergies, medications and Medical/Surgical/OB history.    Patient Active Problem List   Diagnosis Date Noted   Encounter for supervision of low-risk pregnancy, antepartum 10/09/2023   Alpha thalassemia silent carrier 06/04/2022   Carrier of spinal muscular atrophy 06/04/2022     Concerns addressed today Pt is unsure of LMP, she reports it hasn't been regular since pregnancy in 2024 and is still breastfeeding,  Dating US  scheduled 10/14/23 at 3:55pm  Pt reports nausea, safe otc medication list provided.  She would like to try otc before prescription.  Delivery Plans Plans to deliver at [facility]. Discussed the nature of our practice with multiple providers including residents and students as well as female and female providers. Due to the size of the practice, the delivering provider may not be the same as those providing prenatal care.   Patient is interested in water birth.  MyChart/Babyscripts MyChart access verified. I explained pt will have some visits in office and some virtually. Babyscripts instructions given and order placed. Patient verifies receipt of registration text/e-mail. Account successfully created and app  downloaded. If patient is a candidate for Optimized scheduling, add to sticky note.   Blood Pressure Cuff/Weight Scale Pt has Blood pressure cuff.  Explained after first prenatal appt pt will check weekly and document in Babyscripts. Patient does have weight scale.  Anatomy US  Explained first scheduled US  will be around 19 weeks. Anatomy US  will be scheduled after dating is confirmed, per MFM request.  Is patient a CenteringPregnancy candidate?  Declined Declined due to Childcare  Is patient a Mom+Baby Combined Care candidate?  Not a candidate     Is patient a candidate for Babyscripts Optimization? Yes, patient accepted    First visit review I reviewed new OB appt with patient. Explained pt will be seen by Nidia Daring, NP at first visit. Discussed [place] genetic screening with patient. Pt would like Panorama testing. Routine prenatal labs  and Panorama needed at [facility].  Pap smear due as well.   Last Pap Diagnosis  Date Value Ref Range Status  06/17/2019   Final   - Negative for intraepithelial lesion or malignancy (NILM)    Waddell LITTIE Burows, RN 10/09/2023  11:05 AM

## 2023-10-14 ENCOUNTER — Other Ambulatory Visit: Payer: Self-pay | Admitting: Obstetrics and Gynecology

## 2023-10-14 ENCOUNTER — Ambulatory Visit

## 2023-10-14 DIAGNOSIS — Z3A10 10 weeks gestation of pregnancy: Secondary | ICD-10-CM | POA: Diagnosis not present

## 2023-10-14 DIAGNOSIS — Z3491 Encounter for supervision of normal pregnancy, unspecified, first trimester: Secondary | ICD-10-CM | POA: Diagnosis not present

## 2023-10-14 DIAGNOSIS — O219 Vomiting of pregnancy, unspecified: Secondary | ICD-10-CM

## 2023-10-14 DIAGNOSIS — Z3490 Encounter for supervision of normal pregnancy, unspecified, unspecified trimester: Secondary | ICD-10-CM

## 2023-10-16 ENCOUNTER — Encounter: Payer: Self-pay | Admitting: Obstetrics and Gynecology

## 2023-10-16 ENCOUNTER — Ambulatory Visit (INDEPENDENT_AMBULATORY_CARE_PROVIDER_SITE_OTHER): Admitting: Obstetrics and Gynecology

## 2023-10-16 ENCOUNTER — Other Ambulatory Visit: Payer: Self-pay

## 2023-10-16 ENCOUNTER — Other Ambulatory Visit (HOSPITAL_COMMUNITY)
Admission: RE | Admit: 2023-10-16 | Discharge: 2023-10-16 | Disposition: A | Discharge status: Home / Self Care | Source: Ambulatory Visit | Attending: Obstetrics and Gynecology | Admitting: Obstetrics and Gynecology

## 2023-10-16 VITALS — BP 112/66 | HR 82 | Wt 137.0 lb

## 2023-10-16 DIAGNOSIS — O09891 Supervision of other high risk pregnancies, first trimester: Secondary | ICD-10-CM | POA: Diagnosis not present

## 2023-10-16 DIAGNOSIS — Z148 Genetic carrier of other disease: Secondary | ICD-10-CM | POA: Insufficient documentation

## 2023-10-16 DIAGNOSIS — D563 Thalassemia minor: Secondary | ICD-10-CM | POA: Diagnosis not present

## 2023-10-16 DIAGNOSIS — Z3A11 11 weeks gestation of pregnancy: Secondary | ICD-10-CM | POA: Insufficient documentation

## 2023-10-16 DIAGNOSIS — Z3490 Encounter for supervision of normal pregnancy, unspecified, unspecified trimester: Secondary | ICD-10-CM | POA: Diagnosis present

## 2023-10-16 DIAGNOSIS — Z3491 Encounter for supervision of normal pregnancy, unspecified, first trimester: Secondary | ICD-10-CM | POA: Diagnosis not present

## 2023-10-16 DIAGNOSIS — O09899 Supervision of other high risk pregnancies, unspecified trimester: Secondary | ICD-10-CM

## 2023-10-16 NOTE — Progress Notes (Unsigned)
 INITIAL PRENATAL VISIT  Subjective:   Karen Rojas is being seen today for her first obstetrical visit.  She is at [redacted]w[redacted]d gestation by dating US . Her obstetrical history is significant for short interval . Relationship with FOB: involved. Patient does intend to breast feed. Pregnancy history fully reviewed.  Patient reports no symptoms.   Indications for ASA therapy (per uptodate) One of the following: Previous pregnancy with preeclampsia, especially early onset and with an adverse outcome No Multifetal gestation No Chronic hypertension No Type 1 or 2 diabetes mellitus No Chronic kidney disease No Autoimmune disease (antiphospholipid syndrome, systemic lupus erythematosus) No  Two or more of the following: Nulliparity No Obesity (body mass index >30 kg/m2) No Family history of preeclampsia in mother or sister No Age >=35 years No Sociodemographic characteristics (African American race, low socioeconomic level) Yes Personal risk factors (eg, previous pregnancy with low birth weight or small for gestational age infant, previous adverse pregnancy outcome [eg, stillbirth], interval >10 years between pregnancies) No     Early screening tests: FBS, A1C, Random CBG, glucose challenge   Objective:    Obstetric History OB History  Gravida Para Term Preterm AB Living  3 2 2  0 0 2  SAB IAB Ectopic Multiple Live Births  0 0 0 0 2    # Outcome Date GA Lbr Len/2nd Weight Sex Type Anes PTL Lv  3 Current           2 Term 11/03/22 [redacted]w[redacted]d 06:53 / 00:06 3060 g F Vag-Spont EPI  LIV  1 Term 12/28/19 [redacted]w[redacted]d 34:20 / 00:26 3335 g F Vag-Spont EPI  LIV    Past Medical History:  Diagnosis Date   Asthma    years since last used inhaler   Supervision of other normal pregnancy, antepartum 05/28/2022              NURSING     PROVIDER      Emergency planning/management officer for Women    Dating by    LMP      [facility] Model    Traditional    Anatomy U/S           Initiated care at     [employer]     English                     LAB RESULTS       Support Person    [facility](FOB)    Genetics    NIPS:     LR female        AFP:  negative                          NT/IT (FT only)           SVD (spontaneous vaginal delivery) 11/03/2022   Urinary tract bacterial infections     Past Surgical History:  Procedure Laterality Date   NO PAST SURGERIES      Current Outpatient Medications on File Prior to Visit  Medication Sig Dispense Refill   Prenatal Vit-Fe Phos-FA-Omega (VITAFOL  GUMMIES) 3.33-0.333-34.8 MG CHEW Chew 1 tablet by mouth daily. 90 tablet 4   No current facility-administered medications on file prior to visit.    Allergies  Allergen Reactions   Sulfa  Antibiotics Hives  Social History:  reports that she quit smoking about 6 years ago. Her smoking use included cigarettes. She has never used smokeless tobacco. She reports that she does not currently use alcohol. She reports that she does not currently use drugs after having used the following drugs: Marijuana.  Family History  Problem Relation Age of Onset   Kidney disease Mother     The following portions of the patient's history were reviewed and updated as appropriate: allergies, current medications, past family history, past medical history, past social history, past surgical history and problem list.  Review of Systems   Review of Systems  Constitutional: Negative.   HENT: Negative.    Eyes: Negative.   Respiratory: Negative.    Cardiovascular: Negative.   Gastrointestinal: Negative.   Genitourinary: Negative.   Musculoskeletal: Negative.   Skin: Negative.   Neurological: Negative.   Endo/Heme/Allergies: Negative.   Psychiatric/Behavioral: Negative.       Physical Exam:  BP 112/66   Pulse 82   Wt 62.1 kg   LMP 06/07/2023 (Approximate)   BMI 25.06 kg/m  CONSTITUTIONAL: Well-developed, well-nourished female in no acute distress.  HENT:  Normocephalic, atraumatic.   EYES:  Conjunctivae normal. NECK: Normal range of motion SKIN: Skin is warm and dry. MUSCULOSKELETAL: Normal range of motion NEUROLOGIC: Alert and oriented  PSYCHIATRIC: Normal mood and affect. Normal behavior.  CARDIOVASCULAR: Normal heart rate noted RESPIRATORY: normal effort ABDOMEN: Soft, gravid PELVIC: Normal  Fetal Heart Rate (bpm): 155   Movement: Absent       Assessment:    Pregnancy: G3P2002  There are no diagnoses linked to this encounter.    Plan:  Initial labs drawn. Prenatal vitamins. Problem list reviewed and updated. Reviewed in detail the nature of the practice with collaborative care between  Genetic screening discussed: NIPS/First trimester screen/Quad/AFP ordered. Role of ultrasound in pregnancy discussed; Anatomy US : ordered. Follow up in 4 weeks.  Weight gain recommendations per IOM guidelines reviewed: underweight/BMI 18.5 or less > 28 - 40 lbs; normal weight/BMI 18.5 - 24.9 > 25 - 35 lbs; overweight/BMI 25 - 29.9 > 15 - 25 lbs; obese/BMI  30 or more > 11 - 20 lbs.   Discussed clinic routines, schedule of care and testing, genetic screening options, involvement of students and residents under the direct supervision of APPs and doctors and presence of female providers. Pt verbalized understanding.  1. Encounter for supervision of low-risk pregnancy, antepartum (Primary) - CBC/D/Plt+RPR+Rh+ABO+RubIgG... - Culture, OB Urine - Hemoglobin A1c - Cytology - PAP( [place]) - PANORAMA PRENATAL TEST - US  MFM OB DETAIL +14 WK; Future  2. Short interval between pregnancies affecting pregnancy, antepartum -Last delivery 11/03/2022- no complications with pregnancy or delivery  3. [redacted] weeks gestation of pregnancy -Feeling well today. BP and FHR appropriate. -Reoriented to practice  -Babyscripts optimized scheduling for appointments -Anatomy US  scheduled for 12/15/2023  4. Alpha thalassemia silent carrier - History of, Has been seen by genetic counselor in previous  pregnancy  5. Carrier of spinal muscular atrophy -History of, Has been seen by genetic counselor in previous pregnancy    Rolin Amel, S-WHNP 10/16/2023 3:00 PM

## 2023-10-17 ENCOUNTER — Ambulatory Visit: Payer: Self-pay | Admitting: Obstetrics and Gynecology

## 2023-10-17 LAB — CBC/D/PLT+RPR+RH+ABO+RUBIGG...
Antibody Screen: NEGATIVE
Basophils Absolute: 0.1 x10E3/uL (ref 0.0–0.2)
Basos: 1 %
EOS (ABSOLUTE): 0.4 x10E3/uL (ref 0.0–0.4)
Eos: 6 %
HCV Ab: NONREACTIVE
HIV Screen 4th Generation wRfx: NONREACTIVE
Hematocrit: 35.1 % (ref 34.0–46.6)
Hemoglobin: 10.9 g/dL — ABNORMAL LOW (ref 11.1–15.9)
Hepatitis B Surface Ag: NEGATIVE
Immature Grans (Abs): 0 x10E3/uL (ref 0.0–0.1)
Immature Granulocytes: 0 %
Lymphocytes Absolute: 2.2 x10E3/uL (ref 0.7–3.1)
Lymphs: 37 %
MCH: 24.9 pg — ABNORMAL LOW (ref 26.6–33.0)
MCHC: 31.1 g/dL — ABNORMAL LOW (ref 31.5–35.7)
MCV: 80 fL (ref 79–97)
Monocytes Absolute: 0.5 x10E3/uL (ref 0.1–0.9)
Monocytes: 8 %
Neutrophils Absolute: 3 x10E3/uL (ref 1.4–7.0)
Neutrophils: 48 %
Platelets: 260 x10E3/uL (ref 150–450)
RBC: 4.37 x10E6/uL (ref 3.77–5.28)
RDW: 13.1 % (ref 11.7–15.4)
RPR Ser Ql: NONREACTIVE
Rh Factor: POSITIVE
Rubella Antibodies, IGG: 2.9 {index} (ref >0.99)
WBC: 6.1 x10E3/uL (ref 3.4–10.8)

## 2023-10-17 LAB — HEMOGLOBIN A1C
Est. average glucose Bld gHb Est-mCnc: 105 mg/dL
Hgb A1c MFr Bld: 5.3 % (ref 4.8–5.6)

## 2023-10-17 LAB — HCV INTERPRETATION

## 2023-10-18 LAB — CULTURE, OB URINE

## 2023-10-18 LAB — URINE CULTURE, OB REFLEX

## 2023-10-20 LAB — CYTOLOGY - PAP
Chlamydia: NEGATIVE
Comment: NEGATIVE
Comment: NORMAL
Diagnosis: NEGATIVE
Neisseria Gonorrhea: NEGATIVE

## 2023-10-23 LAB — PANORAMA PRENATAL TEST FULL PANEL:PANORAMA TEST PLUS 5 ADDITIONAL MICRODELETIONS: FETAL FRACTION: 9.6

## 2023-12-01 DIAGNOSIS — O09899 Supervision of other high risk pregnancies, unspecified trimester: Secondary | ICD-10-CM | POA: Insufficient documentation

## 2023-12-15 ENCOUNTER — Encounter: Payer: Self-pay | Admitting: Obstetrics and Gynecology

## 2023-12-15 ENCOUNTER — Ambulatory Visit: Attending: Obstetrics and Gynecology

## 2023-12-15 ENCOUNTER — Other Ambulatory Visit: Payer: Self-pay | Admitting: *Deleted

## 2023-12-15 ENCOUNTER — Ambulatory Visit (INDEPENDENT_AMBULATORY_CARE_PROVIDER_SITE_OTHER): Admitting: Obstetrics and Gynecology

## 2023-12-15 ENCOUNTER — Ambulatory Visit (HOSPITAL_BASED_OUTPATIENT_CLINIC_OR_DEPARTMENT_OTHER): Admitting: Obstetrics and Gynecology

## 2023-12-15 VITALS — BP 113/68 | HR 67 | Wt 134.8 lb

## 2023-12-15 DIAGNOSIS — Z148 Genetic carrier of other disease: Secondary | ICD-10-CM

## 2023-12-15 DIAGNOSIS — O365920 Maternal care for other known or suspected poor fetal growth, second trimester, not applicable or unspecified: Secondary | ICD-10-CM

## 2023-12-15 DIAGNOSIS — Z3490 Encounter for supervision of normal pregnancy, unspecified, unspecified trimester: Secondary | ICD-10-CM | POA: Insufficient documentation

## 2023-12-15 DIAGNOSIS — O09892 Supervision of other high risk pregnancies, second trimester: Secondary | ICD-10-CM

## 2023-12-15 DIAGNOSIS — Z3A19 19 weeks gestation of pregnancy: Secondary | ICD-10-CM | POA: Insufficient documentation

## 2023-12-15 DIAGNOSIS — D563 Thalassemia minor: Secondary | ICD-10-CM | POA: Diagnosis not present

## 2023-12-15 DIAGNOSIS — Z363 Encounter for antenatal screening for malformations: Secondary | ICD-10-CM | POA: Diagnosis not present

## 2023-12-15 DIAGNOSIS — D560 Alpha thalassemia: Secondary | ICD-10-CM | POA: Insufficient documentation

## 2023-12-15 DIAGNOSIS — O09899 Supervision of other high risk pregnancies, unspecified trimester: Secondary | ICD-10-CM

## 2023-12-15 DIAGNOSIS — O365990 Maternal care for other known or suspected poor fetal growth, unspecified trimester, not applicable or unspecified: Secondary | ICD-10-CM

## 2023-12-15 DIAGNOSIS — Z362 Encounter for other antenatal screening follow-up: Secondary | ICD-10-CM

## 2023-12-15 NOTE — Patient Instructions (Signed)
  Considering Waterbirth? Guide for patients at [facility]) Why consider waterbirth? Gentle birth for babies  Less pain medicine used in labor  May allow for passive descent/less pushing  May reduce perineal tears  More mobility and instinctive maternal position changes  Increased maternal relaxation   Is waterbirth safe? What are the risks of infection, drowning or other complications? Infection:  Very low risk (3.7 % for tub vs 4.8% for bed)  7 in 8000 waterbirths with documented infection  Poorly cleaned equipment most common cause  Slightly lower group B strep transmission rate  Drowning  Maternal:  Very low risk  Related to seizures or fainting  Newborn:  Very low risk. No evidence of increased risk of respiratory problems in multiple large studies  Physiological protection from breathing under water  Avoid underwater birth if there are any fetal complications  Once baby's head is out of the water, keep it out.  Birth complication  Some reports of cord trauma, but risk decreased by bringing baby to surface gradually  No evidence of increased risk of shoulder dystocia. Mothers can usually change positions faster in water than in a bed, possibly aiding the maneuvers to free the shoulder.   There are 2 things you MUST do to have a waterbirth with [facility]: Attend a waterbirth class at [facility] at [facility]   3rd Wednesday of every month from 7-9 pm (virtual during COVID) [employer] at [email or web address] or [email or web address] or by calling [phone] Bring [place] the certificate from the class to your prenatal appointment or send via MyChart Meet with a midwife at 36 weeks* to see if you can still plan a waterbirth and to sign the consent.   *We also recommend that you schedule as many of your prenatal visits with a midwife as possible.    Helpful information: You may want to bring a bathing suit top to the hospital  to wear during labor but this is optional.  All other supplies are provided by the hospital. Please arrive at the hospital with signs of active labor, and do not wait at home until late in labor. It takes 45 min- 1 hour for fetal monitoring, and check in to your room to take place, plus transport and filling of the waterbirth tub.    Things that would prevent you from having a waterbirth: Premature, <37wks  Previous cesarean birth  Presence of thick meconium-stained fluid  Multiple gestation (Twins, triplets, etc.)  Uncontrolled diabetes or gestational diabetes requiring medication  Hypertension diagnosed in pregnancy or preexisting hypertension (gestational hypertension, preeclampsia, or chronic hypertension) Fetal growth restriction (your baby measures less than 10th percentile on ultrasound) Heavy vaginal bleeding  Non-reassuring fetal heart rate  Active infection (MRSA, etc.). Group B Strep is NOT a contraindication for waterbirth.  If your labor has to be induced and induction method requires continuous monitoring of the baby's heart rate  Other risks/issues identified by your obstetrical provider   Please remember that birth is unpredictable. Under certain unforeseeable circumstances your provider may advise against giving birth in the tub. These decisions will be made on a case-by-case basis and with the safety of you and your baby as our highest priority.

## 2023-12-15 NOTE — Progress Notes (Signed)
 Maternal-Fetal Medicine Consultation  Name: Karen Rojas  MRN: [id number]  GA: [id number] [redacted]w[redacted]d   Patient is here for fetal anatomy scan.  Her pregnancy is dated by 10-week ultrasound. On cell-free fetal DNA screening, the risks of aneuploidies are not increased.  Obstetrical history is significant for 2 term vaginal deliveries.  Most recent delivery was in October 2024.  Her children are in good health.  Ultrasound Fetal biometry is consistent with her previously-established dates (biometry lags gestational age by 6 days). Normal amniotic fluid.  No makers of aneuploidies or fetal structural defects are seen.  Patient understands the limitations of ultrasound in detecting fetal anomalies.   Short Interpregnancy Interval It is defined as the time interval (18 to 24 months) between the end of previous pregnancy and the beginning of next pregnancy. The impact of short pregnancy interval on the outcome of subsequent pregnancy is uncertain. Some studies have shown that congenital anomalies, preterm delivery, and fetal growth restriction rates are increased in pregnancies with short interpregnancy interval.  However, it is not supported by other reports. Overall, we should expect good pregnancy outcomes if there are no other high-risk factors. Patient is a silent carrier for alpha thalassemia and has an increased carrier risk for spinal muscular atrophy.  All her previous children are from the same partner. I discussed the significance of these screening results and recommended partner screening.  Patient opted not to screen.  Recommendations -An appointment was made for her to return in 5 weeks for fetal growth assessment. -Fetal growth at 32-weeks' gestation.     Consultation including face-to-face (more than 50%) counseling 30 minutes.

## 2023-12-15 NOTE — Progress Notes (Signed)
   PRENATAL VISIT NOTE  Subjective:  Karen Rojas is a 28 y.o. G3P2002 at [redacted]w[redacted]d being seen today for ongoing prenatal care.  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  Patient reports left hip pain - started with the first of the year but been getting worse. Usually worse with walking, holding baby.   . Vag. Bleeding: None.  Movement: Present. Denies leaking of fluid.   The following portions of the patient's history were reviewed and updated as appropriate: allergies, current medications, past family history, past medical history, past social history, past surgical history and problem list.   Objective:   Vitals:   12/15/23 1600  BP: 113/68  Pulse: 67  Weight: 134 lb 12.8 oz (61.1 kg)    Fetal Status:  Fetal Heart Rate (bpm): 145   Movement: Present    General: Alert, oriented and cooperative. Patient is in no acute distress.  Skin: Skin is warm and dry. No rash noted.   Cardiovascular: Normal heart rate noted  Respiratory: Normal respiratory effort, no problems with respiration noted  Abdomen: Soft, gravid, appropriate for gestational age.  Pain/Pressure: Absent     Pelvic: Cervical exam deferred        Extremities: Normal range of motion.     Mental Status: Normal mood and affect. Normal behavior. Normal judgment and thought content.   Assessment and Plan:  Pregnancy: G3P2002 at [redacted]w[redacted]d 1. Encounter for supervision of low-risk pregnancy, antepartum (Primary) Anatomy US  done today - 7%ile.  For hip pain, discussed belly band, sleeping positions, PT referral.  AFP today.  28wks labs next time.  Discussed WB, will have her see CNM. Discussed class.   2. Pregnancy with 19 completed weeks  gestation  3. 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 Discussed option for testing for baby after delivery.   4. Alpha thalassemia silent carrier See above.   5. Short interval between pregnancies affecting pregnancy, antepartum <12 months.  Has f/u US  on 12/22. Growth was 7%ile on anatomy US . Normal CL. Can resolve. Reviewed would not be related to her diet or hydration. More likely constitutional vs placenta. Will follow with repeat US .   Preterm labor symptoms and general obstetric precautions including but not limited to vaginal bleeding, contractions, leaking of fluid and fetal movement were reviewed in detail with the patient. Please refer to After Visit Summary for other counseling recommendations.   Return in about 8 weeks (around 02/09/2024) for 2 hr GTT/28w labs, tdap, babyscripts.  Future Appointments  Date Time Provider Department Center  01/19/2024  2:15 PM [facility] PROVIDER 1 [facility]  01/19/2024  2:30 PM [facility] US1 [facility]    Vina Solian, MD

## 2023-12-16 LAB — AFP, SERUM, OPEN SPINA BIFIDA
AFP MoM: 1.77
AFP Value: 102.7 ng/mL
Gest. Age on Collection Date: 19 wk
Maternal Age At EDD: 28.8 a
OSBR Risk 1 IN: 2747
Test Results:: NEGATIVE
Weight: 134 [lb_av]

## 2023-12-17 ENCOUNTER — Ambulatory Visit: Payer: Self-pay | Admitting: Obstetrics and Gynecology

## 2023-12-17 DIAGNOSIS — Z3490 Encounter for supervision of normal pregnancy, unspecified, unspecified trimester: Secondary | ICD-10-CM

## 2024-01-19 ENCOUNTER — Ambulatory Visit

## 2024-01-19 ENCOUNTER — Ambulatory Visit: Attending: Obstetrics and Gynecology

## 2024-01-19 VITALS — BP 121/62 | HR 74

## 2024-01-19 DIAGNOSIS — O365990 Maternal care for other known or suspected poor fetal growth, unspecified trimester, not applicable or unspecified: Secondary | ICD-10-CM | POA: Insufficient documentation

## 2024-01-19 DIAGNOSIS — Z362 Encounter for other antenatal screening follow-up: Secondary | ICD-10-CM | POA: Diagnosis present

## 2024-01-19 DIAGNOSIS — O09292 Supervision of pregnancy with other poor reproductive or obstetric history, second trimester: Secondary | ICD-10-CM

## 2024-01-19 DIAGNOSIS — Z3A24 24 weeks gestation of pregnancy: Secondary | ICD-10-CM

## 2024-01-19 NOTE — Progress Notes (Signed)
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 G3P2002 at [redacted]w[redacted]d here for ultrasound and consultation. [facility] Karen Rojas is doing well today with no acute concerns. Today we focused on the following:   The patient is here today due to a previous scan showing growth at the 7th percentile.  I encouraged the patient today the growth is normal at the 29th percentile.  We discussed this reflects normal growth but would like to see at least 2 growth scans in the normal percentiles before we dismiss her from our practice to where she only sees her OB provider.  She will be scheduled for a follow-up ultrasound in 4 weeks.  We discussed that this will not change her due date based on today's ultrasound but her delivery date may be subject to change based on the rest of the prenatal course.  Recommendations - Follow-up in 4 weeks  Standard OB precautions were given to the patient. The patient had time to ask questions that were answered to her satisfaction.  She verbalized understanding and agrees to proceed with the plan above.   I spent 30 minutes reviewing the patients chart, including labs and images as well as counseling the patient about her medical conditions. Greater than 50% of the time was spent in direct face-to-face patient counseling.  Delora Smaller  MFM, [place]   01/19/2024  3:13 PM   Review of Systems: A  review of systems was performed and was negative except per HPI   Vitals and Physical Exam    01/19/2024    2:21 PM 12/15/2023    4:00 PM 10/16/2023    2:37 PM  Vitals with BMI  Weight  134 lbs 13 oz 137 lbs  Systolic 121 113 887  Diastolic 62 68 66  Pulse 74 67 82    Sitting comfortably on the sonogram table Nonlabored breathing Normal rate and rhythm Abdomen is nontender  Past pregnancies OB History  Gravida Para Term Preterm AB Living  3 2 2  0 0 2  SAB IAB Ectopic Multiple Live Births  0 0 0 0 2    # Outcome Date GA Lbr Len/2nd Weight Sex Type Anes PTL Lv  3 Current           2 Term 11/03/22 [redacted]w[redacted]d 06:53 / 00:06 6 lb 11.9 oz (3.06 kg) F Vag-Spont EPI  LIV  1 Term 12/28/19 [redacted]w[redacted]d 34:20 / 00:26 7 lb 5.6 oz (3.335 kg) F Vag-Spont EPI  LIV     Future Appointments  Date Time Provider Department Center  02/10/2024  9:00 AM [facility]  02/10/2024 10:15 AM Regino, Camie LABOR, CNM [facility]      "

## 2024-02-04 ENCOUNTER — Encounter: Payer: Self-pay | Admitting: Obstetrics and Gynecology

## 2024-02-05 ENCOUNTER — Encounter: Payer: Self-pay | Admitting: Family Medicine

## 2024-02-05 ENCOUNTER — Telehealth: Admitting: Family Medicine

## 2024-02-05 NOTE — Progress Notes (Signed)
 The patient no-showed for appointment despite this provider sending direct link, reaching out via phone with no response and waiting for at least 10 minutes from appointment time for patient to join. They will be marked as a NS for this appointment/time.   Freddy Finner, NP

## 2024-02-08 ENCOUNTER — Ambulatory Visit
Admission: RE | Admit: 2024-02-08 | Discharge: 2024-02-08 | Disposition: A | Discharge status: Home / Self Care | Source: Ambulatory Visit

## 2024-02-08 VITALS — BP 114/74 | HR 83 | Temp 98.1°F | Resp 16

## 2024-02-08 DIAGNOSIS — N760 Acute vaginitis: Secondary | ICD-10-CM | POA: Insufficient documentation

## 2024-02-08 MED ORDER — CLOTRIMAZOLE 3 2 % VA CREA: 1.0000 | TOPICAL_CREAM | Freq: Every day | VAGINAL | 0 refills | Status: AC

## 2024-02-08 NOTE — ED Triage Notes (Addendum)
 Pt present vaginal itching, symptoms started on Friday. Pt states she tried OTC monistat cream on 01/26/24 but the symptoms came back on Friday.  Pt requesting full STD screening.

## 2024-02-08 NOTE — ED Provider Notes (Signed)
 " [facility]    CSN: [id number] Arrival date & time: 02/08/24  1401      History   Chief Complaint Chief Complaint  Patient presents with   Vaginal Itching    Pregnant - Entered by patient    HPI Karen Rojas is a 29 y.o. female.   Pt presents today due to vaginal itching that she has been experiencing since Friday. Pt states that she used Monistat on 12/29 with relief of similar symptoms. Pt is concerned because the symptoms returned. Pt states that she started antibiotics on Thursday for UTI. Pt is concerned about STIs.   The history is provided by the patient.  Vaginal Itching    Past Medical History:  Diagnosis Date   Asthma    years since last used inhaler   Supervision of other normal pregnancy, antepartum 05/28/2022              NURSING     PROVIDER      [facility]    Dating by    LMP      [facility] Model    Traditional    Anatomy U/S           Initiated care at     [facility]     English                     LAB RESULTS       Support Person    Karen Rojas(FOB)    Genetics    NIPS:     LR female        AFP:  negative                          NT/IT (FT only)           SVD (spontaneous vaginal delivery) 11/03/2022   Urinary tract bacterial infections     Patient Active Problem List   Diagnosis Date Noted   Fetal growth restriction antepartum 12/15/2023   Short interval between pregnancies affecting pregnancy, antepartum 12/01/2023   Encounter for supervision of low-risk pregnancy, antepartum 10/09/2023   Alpha thalassemia silent carrier 06/04/2022   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 Authorizing Provider  Prenatal Vit-Fe Phos-FA-Omega (VITAFOL  GUMMIES) 3.33-0.333-34.8 MG CHEW Chew 1 tablet by mouth daily. 11/05/22   Rojas, Karen J, DO    Family History Family History  Problem Relation Age of Onset   Kidney disease Mother     Social History Social History[1]   Allergies   Sulfa  antibiotics   Review of Systems Review of Systems   Physical Exam Triage  Vital Signs ED Triage Vitals  Encounter Vitals Group     BP 02/08/24 1415 114/74     Girls Systolic BP Percentile --      Girls Diastolic BP Percentile --      Boys Systolic BP Percentile --      Boys Diastolic BP Percentile --      Pulse Rate 02/08/24 1415 83     Resp 02/08/24 1415 16     Temp 02/08/24 1415 98.1 F (36.7 C)     Temp Source 02/08/24 1415 Oral     SpO2 02/08/24 1415 98 %     Weight --      Height --      Head Circumference --      Peak Flow --      Pain Score 02/08/24 1413 0     Pain Loc --      Pain Education --      Exclude from Growth Chart --    No data found.  Updated Vital Signs BP 114/74 (BP Location: Right Arm)   Pulse 83   Temp 98.1 F (36.7 C) (Oral)   Resp 16   LMP 06/07/2023 (Approximate)   SpO2 98%   Visual Acuity Right Eye Distance:   Left Eye Distance:   Bilateral Distance:    Right Eye Near:   Left Eye Near:    Bilateral Near:     Physical Exam Vitals and nursing note reviewed.  Constitutional:      General: She is not in acute distress.    Appearance: Normal appearance. She is not ill-appearing, toxic-appearing or diaphoretic.  Eyes:     General: No scleral icterus. Cardiovascular:     Rate and Rhythm: Normal rate and regular rhythm.     Heart sounds: Normal heart sounds.  Pulmonary:     Effort: Pulmonary effort is normal. No respiratory distress.     Breath sounds: Normal breath sounds. No wheezing or rhonchi.  Abdominal:     General: Abdomen is flat. Bowel sounds are  normal.     Palpations: Abdomen is soft.     Tenderness: There is no abdominal tenderness. There is no right CVA tenderness or left CVA tenderness.  Skin:    General: Skin is warm.  Neurological:     Mental Status: She is alert and oriented to person, place, and time.  Psychiatric:        Mood and Affect: Mood normal.        Behavior: Behavior normal.      UC Treatments / Results  Labs (all labs ordered are listed, but only abnormal results are displayed) Labs Reviewed - No data to display  EKG   Radiology No results found.  Procedures Procedures (including critical care time)  Medications Ordered in UC Medications - No data to display  Initial Impression / Assessment and Plan / UC Course  I have reviewed the triage vital signs and the nursing notes.  Pertinent labs & imaging results that were available during my care of the patient were reviewed by me and considered in my medical decision making (see chart for details).     Final Clinical Impressions(s) / UC Diagnoses   Final diagnoses:  None   Discharge Instructions   None    ED Prescriptions   None    PDMP not reviewed this encounter.    [1]  Social History Tobacco Use   Smoking status: Former    Current packs/day: 0.00  Types: Cigarettes    Quit date: 2019    Years since quitting: 7.0   Smokeless tobacco: Never  Vaping Use   Vaping status: Never Used  Substance Use Topics   Alcohol use: Not Currently    Alcohol/week: 0.0 standard drinks of alcohol   Drug use: Not Currently    Types: Marijuana    Comment: occasional been a couple of years     Karen Rojas 02/08/24 1432  "

## 2024-02-08 NOTE — Discharge Instructions (Signed)
 Antibiotics can cause yeast infections, recently starting antibiotics is most likely what brought on this yeast infection. Start vaginal cream, put in vagina at bedtime for 3 nights

## 2024-02-09 ENCOUNTER — Other Ambulatory Visit: Payer: Self-pay

## 2024-02-09 ENCOUNTER — Ambulatory Visit (HOSPITAL_COMMUNITY): Payer: Self-pay

## 2024-02-09 DIAGNOSIS — Z3490 Encounter for supervision of normal pregnancy, unspecified, unspecified trimester: Secondary | ICD-10-CM

## 2024-02-09 LAB — CERVICOVAGINAL ANCILLARY ONLY
Bacterial Vaginitis (gardnerella): NEGATIVE
Candida Glabrata: NEGATIVE
Candida Vaginitis: POSITIVE — AB
Chlamydia: NEGATIVE
Comment: NEGATIVE
Comment: NEGATIVE
Comment: NEGATIVE
Comment: NEGATIVE
Comment: NEGATIVE
Comment: NORMAL
Neisseria Gonorrhea: NEGATIVE
Trichomonas: NEGATIVE

## 2024-02-09 NOTE — Patient Instructions (Signed)
   Considering Waterbirth? Guide for patients at [facility]) Why consider waterbirth? Gentle birth for babies  Less pain medicine used in labor  May allow for passive descent/less pushing  May reduce perineal tears  More mobility and instinctive maternal position changes  Increased maternal relaxation   Is waterbirth safe? What are the risks of infection, drowning or other complications? Infection:  Very low risk (3.7 % for tub vs 4.8% for bed)  7 in 8000 waterbirths with documented infection  Poorly cleaned equipment most common cause  Slightly lower group B strep transmission rate  Drowning  Maternal:  Very low risk  Related to seizures or fainting  Newborn:  Very low risk. No evidence of increased risk of respiratory problems in multiple large studies  Physiological protection from breathing under water  Avoid underwater birth if there are any fetal complications  Once baby's head is out of the water, keep it out.  Birth complication  Some reports of cord trauma, but risk decreased by bringing baby to surface gradually  No evidence of increased risk of shoulder dystocia. Mothers can usually change positions faster in water than in a bed, possibly aiding the maneuvers to free the shoulder.   There are 2 things you MUST do to have a waterbirth with [facility]: Attend a waterbirth class at [employer] [facility]   3rd Wednesday of every month from 7-9 pm (virtual during COVID) [employer] at [email or web address] or [email or web address] or by calling [phone] Bring us  the certificate from the class to your prenatal appointment or send via MyChart Meet with a midwife at 36 weeks* to see if you can still plan a waterbirth and to sign the consent.   *We also recommend that you schedule as many of your prenatal visits with a midwife as possible.    Helpful information: You may want to bring a bathing suit top to the hospital  to wear during labor but this is optional.  All other supplies are provided by the hospital. Please arrive at the hospital with signs of active labor, and do not wait at home until late in labor. It takes 45 min- 1 hour for fetal monitoring, and check in to your room to take place, plus transport and filling of the waterbirth tub.    Things that would prevent you from having a waterbirth: Premature, <37wks  Previous cesarean birth  Presence of thick meconium-stained fluid  Multiple gestation (Twins, triplets, etc.)  Uncontrolled diabetes or gestational diabetes requiring medication  Hypertension diagnosed in pregnancy or preexisting hypertension (gestational hypertension, preeclampsia, or chronic hypertension) Fetal growth restriction (your baby measures less than 10th percentile on ultrasound) Heavy vaginal bleeding  Non-reassuring fetal heart rate  Active infection (MRSA, etc.). Group B Strep is NOT a contraindication for waterbirth.  If your labor has to be induced and induction method requires continuous monitoring of the baby's heart rate  Other risks/issues identified by your obstetrical provider   Please remember that birth is unpredictable. Under certain unforeseeable circumstances your provider may advise against giving birth in the tub. These decisions will be made on a case-by-case basis and with the safety of you and your baby as our highest priority.    Updated 05/02/21

## 2024-02-09 NOTE — Progress Notes (Unsigned)
 Patient did not arrive to appointment. May reschedule.   Karen Rojas, CNM

## 2024-02-10 ENCOUNTER — Ambulatory Visit: Admitting: Certified Nurse Midwife

## 2024-02-10 ENCOUNTER — Other Ambulatory Visit

## 2024-02-10 DIAGNOSIS — O09899 Supervision of other high risk pregnancies, unspecified trimester: Secondary | ICD-10-CM

## 2024-02-10 DIAGNOSIS — Z3A27 27 weeks gestation of pregnancy: Secondary | ICD-10-CM

## 2024-02-10 DIAGNOSIS — Z3490 Encounter for supervision of normal pregnancy, unspecified, unspecified trimester: Secondary | ICD-10-CM

## 2024-02-16 ENCOUNTER — Ambulatory Visit (HOSPITAL_BASED_OUTPATIENT_CLINIC_OR_DEPARTMENT_OTHER): Admitting: Obstetrics and Gynecology

## 2024-02-16 ENCOUNTER — Ambulatory Visit: Attending: Maternal & Fetal Medicine

## 2024-02-16 VITALS — BP 113/60 | HR 79

## 2024-02-16 DIAGNOSIS — O365930 Maternal care for other known or suspected poor fetal growth, third trimester, not applicable or unspecified: Secondary | ICD-10-CM | POA: Diagnosis not present

## 2024-02-16 DIAGNOSIS — O09293 Supervision of pregnancy with other poor reproductive or obstetric history, third trimester: Secondary | ICD-10-CM

## 2024-02-16 DIAGNOSIS — Z3A28 28 weeks gestation of pregnancy: Secondary | ICD-10-CM | POA: Diagnosis not present

## 2024-02-16 DIAGNOSIS — D649 Anemia, unspecified: Secondary | ICD-10-CM | POA: Diagnosis not present

## 2024-02-16 DIAGNOSIS — O99013 Anemia complicating pregnancy, third trimester: Secondary | ICD-10-CM | POA: Diagnosis not present

## 2024-02-16 DIAGNOSIS — O365990 Maternal care for other known or suspected poor fetal growth, unspecified trimester, not applicable or unspecified: Secondary | ICD-10-CM | POA: Diagnosis present

## 2024-02-16 NOTE — Progress Notes (Signed)
 After review, MFM consult with provider is not indicated for today  Karen Ranks, MD 02/16/2024 4:19 PM  [facility]

## 2024-02-19 ENCOUNTER — Other Ambulatory Visit

## 2024-02-20 ENCOUNTER — Other Ambulatory Visit

## 2024-02-20 DIAGNOSIS — Z3490 Encounter for supervision of normal pregnancy, unspecified, unspecified trimester: Secondary | ICD-10-CM

## 2024-02-21 LAB — CBC
Hematocrit: 30 % — ABNORMAL LOW (ref 34.0–46.6)
Hemoglobin: 9.7 g/dL — ABNORMAL LOW (ref 11.1–15.9)
MCH: 26.1 pg — ABNORMAL LOW (ref 26.6–33.0)
MCHC: 32.3 g/dL (ref 31.5–35.7)
MCV: 81 fL (ref 79–97)
Platelets: 241 10*3/uL (ref 150–450)
RBC: 3.71 x10E6/uL — ABNORMAL LOW (ref 3.77–5.28)
RDW: 12.2 % (ref 11.7–15.4)
WBC: 6.2 10*3/uL (ref 3.4–10.8)

## 2024-02-21 LAB — GLUCOSE TOLERANCE, 2 HOURS W/ 1HR
Glucose, 1 hour: 105 mg/dL (ref 70–179)
Glucose, 2 hour: 86 mg/dL (ref 70–152)
Glucose, Fasting: 74 mg/dL (ref 70–91)

## 2024-02-21 LAB — SYPHILIS: RPR W/REFLEX TO RPR TITER AND TREPONEMAL ANTIBODIES, TRADITIONAL SCREENING AND DIAGNOSIS ALGORITHM: RPR Ser Ql: NONREACTIVE

## 2024-02-21 LAB — HIV ANTIBODY (ROUTINE TESTING W REFLEX): HIV Screen 4th Generation wRfx: NONREACTIVE

## 2024-02-24 ENCOUNTER — Ambulatory Visit: Payer: Self-pay | Admitting: Certified Nurse Midwife

## 2024-02-24 DIAGNOSIS — O99013 Anemia complicating pregnancy, third trimester: Secondary | ICD-10-CM

## 2024-02-24 MED ORDER — ACCRUFER 30 MG PO CAPS: 1.0000 | ORAL_CAPSULE | Freq: Every day | ORAL | 5 refills | Status: AC

## 2024-03-09 ENCOUNTER — Encounter: Payer: Self-pay | Admitting: Obstetrics and Gynecology

## 2024-03-23 ENCOUNTER — Ambulatory Visit

## 2024-04-06 ENCOUNTER — Encounter: Payer: Self-pay | Admitting: Advanced Practice Midwife

## 2024-04-13 ENCOUNTER — Encounter: Payer: Self-pay | Admitting: Obstetrics and Gynecology

## 2024-04-20 ENCOUNTER — Encounter: Payer: Self-pay | Admitting: Obstetrics and Gynecology

## 2024-04-26 ENCOUNTER — Encounter: Payer: Self-pay | Admitting: Obstetrics and Gynecology
